# Patient Record
Sex: Male | Born: 1970 | Race: Black or African American | Hispanic: No | Marital: Single | Smoking: Never smoker
Health system: Southern US, Community
[De-identification: ages and names within clinical notes are randomized; demographics above are authoritative.]

## PROBLEM LIST (undated history)

## (undated) DIAGNOSIS — I10 Essential (primary) hypertension: Secondary | ICD-10-CM

## (undated) DIAGNOSIS — M109 Gout, unspecified: Secondary | ICD-10-CM

## (undated) DIAGNOSIS — E119 Type 2 diabetes mellitus without complications: Secondary | ICD-10-CM

## (undated) HISTORY — PX: JOINT REPLACEMENT: SHX530

---

## 2000-03-15 ENCOUNTER — Encounter (INDEPENDENT_AMBULATORY_CARE_PROVIDER_SITE_OTHER): Payer: Self-pay | Admitting: Specialist

## 2000-03-15 ENCOUNTER — Ambulatory Visit (HOSPITAL_COMMUNITY): Admission: RE | Admit: 2000-03-15 | Discharge: 2000-03-15 | Payer: Self-pay | Admitting: Urology

## 2000-10-15 ENCOUNTER — Encounter (INDEPENDENT_AMBULATORY_CARE_PROVIDER_SITE_OTHER): Payer: Self-pay | Admitting: Specialist

## 2000-10-15 ENCOUNTER — Ambulatory Visit (HOSPITAL_COMMUNITY): Admission: RE | Admit: 2000-10-15 | Discharge: 2000-10-15 | Payer: Self-pay | Admitting: Urology

## 2001-08-15 ENCOUNTER — Encounter (INDEPENDENT_AMBULATORY_CARE_PROVIDER_SITE_OTHER): Payer: Self-pay | Admitting: Specialist

## 2001-08-15 ENCOUNTER — Other Ambulatory Visit: Admission: RE | Admit: 2001-08-15 | Discharge: 2001-08-15 | Payer: Self-pay | Admitting: Otolaryngology

## 2008-02-20 ENCOUNTER — Encounter: Admission: RE | Admit: 2008-02-20 | Discharge: 2008-02-20 | Payer: Self-pay | Admitting: Family Medicine

## 2009-02-24 ENCOUNTER — Ambulatory Visit (HOSPITAL_BASED_OUTPATIENT_CLINIC_OR_DEPARTMENT_OTHER): Admission: RE | Admit: 2009-02-24 | Discharge: 2009-02-24 | Payer: Self-pay | Admitting: Orthopedic Surgery

## 2011-04-07 NOTE — Op Note (Signed)
[facility]  Patient:    Jonathan Park, Jonathan Park                   MRN: [id number] Proc. Date: 03/15/00 Adm. Date:  [id number] Attending:  Laqueta Jean                           Operative Report  PREOPERATIVE DIAGNOSIS:  Left varicocele.  POSTOPERATIVE DIAGNOSIS:  Left varicocele.  OPERATION:  Left internal spermatic vein ligation.  SURGEON:  Sigmund I. Patsi Sears, M.D.  ANESTHESIA:  General (LMA).  PREPARATION:  After preoperative preanesthesia, the patient was brought to the operating room and placed on the operating table in the dorsosupine position, where general anesthesia was introduced.  He remained in position, where the pubis was prepped with Betadine solution after shaving, and prepped with Betadine solution and draped in the usual fashion.  DESCRIPTION OF PROCEDURE:  An 8 cm left inguinal incision was made and subcutaneous tissue dissected with the electrosurgical unit.  The spermatic cord was identified and a tape placed underneath the spermatic cord at the level of the pubic tubercle. The spermatic cord was then dissected, with care taken to avoid injury to the artery to the testicle or to the vas or its vessels.  Following this, all multiple veins were identified and ligated with 3-0 silk suture.  The remaining spermatic cord was anesthetized with 0.5 plain Marcaine and replaced in its bed.  The distal portion of the transversus abdominis muscle which was incised which was closed, with no injuries to the ilioinguinal nerve.  The subcutaneous fascia was closed  with running 3-0 Vicryl suture and the skin was closed with skin stapler.  The patient was then awakened and taken to the recovery room after local anesthetic was given in the skin, and given IV Toradol and antiemetic.  He was taken to the recovery room in good condition. DD:  03/15/00 TD:  03/15/00 Job: [id number]

## 2011-04-07 NOTE — Op Note (Signed)
[facility]  Patient:    Jonathan Park, Jonathan Park                     MRN: [id number] Proc. Date: 10/15/00 Attending:  Vonzell Schlatter. Patsi Sears, M.D.                           Operative Report  PREOPERATIVE DIAGNOSES:  Obstructed penile vein and lymphatic.  POSTOPERATIVE DIAGNOSES:  Obstructed penile vein and lymphatic.  OPERATION PERFORMED:  Excision of obstructed penile vein and lymphatic.  SURGEON:  Dr. Patsi Sears.  ANESTHESIA:  General (LMA).  PREPARATION:  After appropriate preanesthesia, the patient was brought to the operating room and placed on the operating table in dorsal supine position where general LMA anesthesia was introduced. He remained in this position where the penis was prepped with Betadine solution and draped in the usual fashion.  DESCRIPTION OF PROCEDURE:  A circumcising incision was made in the skin sleeve proximal-ward. A large penile vein was identified and this was dissected proximally and laterally. Also a superficial obstructed lymphatic was identified. Both of these were ligated with 4-0 silk suture. A button hole was observed in the hole at the dorsal of the penis and this was closed from the inside with 2 layers of 5-0 Vicryl suture. Minimal bleeding was noted. The wound was irrigated with saline solution. The wound was closed in the following fashion:  CLOSURE:  The distal penile skin was drawn down toward the glans. Four separate quadrant sutures of 4-0 Vicryl suture were then created, and each quadrant was closed with interrupted 4-0 Vicryl suture. Sterile dressings were applied, and the patient was awakened and taken to the recovery room in good condition. DD:  10/15/00 TD:  10/15/00 Job: [id number]

## 2011-04-07 NOTE — Op Note (Signed)
NAME:  Jonathan Park, Jonathan Park            ACCOUNT NO.:  [id number]   MEDICAL RECORD NO.:  [id number]          PATIENT TYPE:  AMB   LOCATION:  DSC                          FACILITY:  [facility]   PHYSICIAN:  Matthew A. Weingold, M.D.DATE OF BIRTH:  [birth year]   DATE OF PROCEDURE:  DATE OF DISCHARGE:                               OPERATIVE REPORT   PREOPERATIVE DIAGNOSIS:  Recurrent right cubital tunnel syndrome.   POSTOPERATIVE DIAGNOSIS:  Recurrent right cubital tunnel syndrome.   PROCEDURE:  Exploration and decompression, right elbow ulnar nerve with  submuscular transposition.   SURGEONS:  1. Artist Pais Mina Marble, MD  2. Cindee Salt, MD   ANESTHESIA:  General.   TOURNIQUET TIME:  One hour and 5 minutes.   No complications.   No drains.   OPERATIVE REPORT:  The patient was taken to the operating suite.  After  the induction of adequate general anesthesia, right upper extremity was  prepped and draped in usual sterile fashion.  An Esmarch was used to  exsanguinate the limb.  Tourniquet was then inflated to 250 mmHg.  At  this point in time, incorporating the old incision this was extended  proximally and distally 3 cm in our direction.  The skin was incised on  the medial aspect of the elbow between the olecranon process, tip of the  medial epicondyle, and skin was incised sharply.  Dissection was carried  down to the area of the cubital tunnel.  In the proximal aspect of the  incision, the ulnar nerve was identified, it was carefully traced into  the zone of scarring and carefully dissected free throughout the entire  length of the incision with care to identify and retract articular  branches.  Once this was done, dissection was carried down to the level  of the inner muscular septum.  This was carefully traced down to the  insertion on the humerus and excised.  The flexor pronator mass was then  carefully elevated off the medial epicondyle and retracted to the  midline.  Once this  was done and all bleeding points were cauterized,  the ulnar nerve was then transposed anteriorly and the flexor pronator  mass was then repaired using #2 FiberWire suture to its normal insertion  site.  After this was done, all pressure points had been relieved.  There was no undue tension on the nerve.  The wound was thoroughly  irrigated.  It was closed in layers of 2-0 undyed Vicryl and 4-0  Monocryl subcuticular stitch on the skin.  Steri-Strips, 4 x 4's,  fluffs, and a splint was applied with the elbow flexed to neutral, on  the wrist slightly flexed to protect the flexor pronator mass repair.  The patient tolerated the procedure well, went to recovery room in  stable fashion.       Artist Pais Mina Marble, M.D.  Electronically Signed     MAW/MEDQ  D:  02/25/2009  T:  02/25/2009  Job:  [id number]

## 2012-02-20 DIAGNOSIS — E119 Type 2 diabetes mellitus without complications: Secondary | ICD-10-CM | POA: Insufficient documentation

## 2013-01-27 ENCOUNTER — Other Ambulatory Visit: Payer: Self-pay | Admitting: Occupational Medicine

## 2013-01-27 ENCOUNTER — Ambulatory Visit: Payer: Self-pay

## 2013-01-27 DIAGNOSIS — Z021 Encounter for pre-employment examination: Secondary | ICD-10-CM

## 2013-05-01 ENCOUNTER — Emergency Department (HOSPITAL_BASED_OUTPATIENT_CLINIC_OR_DEPARTMENT_OTHER)
Admission: EM | Admit: 2013-05-01 | Discharge: 2013-05-01 | Disposition: A | Discharge status: Home / Self Care | Payer: Self-pay | Attending: Emergency Medicine | Admitting: Emergency Medicine

## 2013-05-01 ENCOUNTER — Telehealth (HOSPITAL_COMMUNITY): Payer: Self-pay | Admitting: Emergency Medicine

## 2013-05-01 ENCOUNTER — Encounter (HOSPITAL_BASED_OUTPATIENT_CLINIC_OR_DEPARTMENT_OTHER): Payer: Self-pay | Admitting: *Deleted

## 2013-05-01 DIAGNOSIS — I10 Essential (primary) hypertension: Secondary | ICD-10-CM | POA: Insufficient documentation

## 2013-05-01 DIAGNOSIS — Z79899 Other long term (current) drug therapy: Secondary | ICD-10-CM | POA: Insufficient documentation

## 2013-05-01 DIAGNOSIS — H109 Unspecified conjunctivitis: Secondary | ICD-10-CM | POA: Insufficient documentation

## 2013-05-01 DIAGNOSIS — E119 Type 2 diabetes mellitus without complications: Secondary | ICD-10-CM | POA: Insufficient documentation

## 2013-05-01 HISTORY — DX: Type 2 diabetes mellitus without complications: E11.9

## 2013-05-01 HISTORY — DX: Essential (primary) hypertension: I10

## 2013-05-01 MED ORDER — HYDROCODONE-ACETAMINOPHEN 5-325 MG PO TABS
1.0000 | ORAL_TABLET | Freq: Once | ORAL | Status: AC
  Administered 2013-05-01: 1 via ORAL
  Filled 2013-05-01: qty 1

## 2013-05-01 MED ORDER — TETRACAINE HCL 0.5 % OP SOLN
OPHTHALMIC | Status: AC
  Filled 2013-05-01: qty 2

## 2013-05-01 MED ORDER — ERYTHROMYCIN 5 MG/GM OP OINT
TOPICAL_OINTMENT | Freq: Four times a day (QID) | OPHTHALMIC | Status: DC
  Administered 2013-05-01: 01:00:00 via OPHTHALMIC
  Filled 2013-05-01: qty 3.5

## 2013-05-01 MED ORDER — HYDROCODONE-ACETAMINOPHEN 5-325 MG PO TABS: 1.0000 | ORAL_TABLET | ORAL | Status: DC | PRN

## 2013-05-01 MED ORDER — FLUORESCEIN SODIUM 1 MG OP STRP
ORAL_STRIP | OPHTHALMIC | Status: AC
  Filled 2013-05-01: qty 1

## 2013-05-01 NOTE — ED Notes (Signed)
Pt c/o left eye redness and irritation x 1 day  

## 2013-05-01 NOTE — ED Provider Notes (Signed)
History     CSN: [id number]  Arrival date & time 05/01/13  0009   First MD Initiated Contact with Patient 05/01/13 0022      Chief Complaint  Patient presents with  . Eye Problem    (Consider location/radiation/quality/duration/timing/severity/associated sxs/prior treatment) HPI This is a 42 year old male with about a 24-hour history of the sensation of a foreign body in his left eye. It has been associated with edema of the eyelids. The symptoms are mild to moderate. He is unaware of any injury. He describes the sensation as like a hair or other object up under his operative. He denies blurred vision. He has used Visine without adequate relief.  Past Medical History  Diagnosis Date  . Diabetes mellitus without complication   . Hypertension     Past Surgical History  Procedure Laterality Date  . Joint replacement      History reviewed. No pertinent family history.  History  Substance Use Topics  . Smoking status: Never Smoker   . Smokeless tobacco: Not on file  . Alcohol Use: No      Review of Systems  All other systems reviewed and are negative.    Allergies  Review of patient's allergies indicates no known allergies.  Home Medications   Current Outpatient Rx  Name  Route  Sig  Dispense  Refill  . lisinopril (PRINIVIL,ZESTRIL) 10 MG tablet   Oral   Take 10 mg by mouth daily.         . metFORMIN (GLUCOPHAGE) 500 MG tablet   Oral   Take 500 mg by mouth 2 (two) times daily with a meal.           BP 130/105  Pulse 87  Temp(Src) 98.4 F (36.9 C) (Oral)  Resp 16  Ht 6\' 5"  (1.956 m)  Wt 250 lb (113.399 kg)  BMI 29.64 kg/m2  SpO2 99%  Physical Exam General: Well-developed, well-nourished male in no acute distress; appearance consistent with age of record HENT: normocephalic, atraumatic Eyes: pupils equal round and reactive to light; extraocular muscles intact; arcus senilis bilaterally; edema of left eyelids without erythema, induration or  tenderness; mild left conjunctival injection; no hyphema or hypopyon; no fluorescein uptake left eye Neck: supple Heart: regular rate and rhythm Lungs: Normal respiratory effort and excursion Abdomen: soft; nondistended Extremities: No deformity; full range of motion Neurologic: Awake, alert and oriented; motor function intact in all extremities and symmetric; no facial droop Skin: Warm and dry Psychiatric: Normal mood and affect    ED Course  Procedures (including critical care time)    MDM  We'll treat for conjunctivitis. Patient was advised the diagnosis is nonspecific.        Hanley Seamen, MD 05/01/13 [id number]

## 2013-10-24 ENCOUNTER — Emergency Department (HOSPITAL_BASED_OUTPATIENT_CLINIC_OR_DEPARTMENT_OTHER): Payer: Self-pay

## 2013-10-24 ENCOUNTER — Encounter (HOSPITAL_BASED_OUTPATIENT_CLINIC_OR_DEPARTMENT_OTHER): Payer: Self-pay | Admitting: Emergency Medicine

## 2013-10-24 ENCOUNTER — Emergency Department (HOSPITAL_BASED_OUTPATIENT_CLINIC_OR_DEPARTMENT_OTHER)
Admission: EM | Admit: 2013-10-24 | Discharge: 2013-10-24 | Disposition: A | Discharge status: Home / Self Care | Payer: Self-pay | Attending: Emergency Medicine | Admitting: Emergency Medicine

## 2013-10-24 DIAGNOSIS — E119 Type 2 diabetes mellitus without complications: Secondary | ICD-10-CM | POA: Insufficient documentation

## 2013-10-24 DIAGNOSIS — M25461 Effusion, right knee: Secondary | ICD-10-CM

## 2013-10-24 DIAGNOSIS — Z79899 Other long term (current) drug therapy: Secondary | ICD-10-CM | POA: Insufficient documentation

## 2013-10-24 DIAGNOSIS — M25469 Effusion, unspecified knee: Secondary | ICD-10-CM | POA: Insufficient documentation

## 2013-10-24 DIAGNOSIS — I10 Essential (primary) hypertension: Secondary | ICD-10-CM | POA: Insufficient documentation

## 2013-10-24 MED ORDER — OXYCODONE-ACETAMINOPHEN 5-325 MG PO TABS
1.0000 | ORAL_TABLET | Freq: Once | ORAL | Status: AC
  Administered 2013-10-24: 1 via ORAL
  Filled 2013-10-24: qty 1

## 2013-10-24 MED ORDER — OXYCODONE-ACETAMINOPHEN 5-325 MG PO TABS: 1.0000 | ORAL_TABLET | ORAL | Status: DC | PRN

## 2013-10-24 NOTE — ED Notes (Signed)
Pt called requesting "referral information". Pt advised to look at discharge paperwork as Dr. Pearletha Forge information is documented for followup

## 2013-10-24 NOTE — ED Provider Notes (Signed)
CSN: [id number]     Arrival date & time 10/24/13  1414 History   First MD Initiated Contact with Patient 10/24/13 1422     Chief Complaint  Patient presents with  . 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 No language interpreter was used.    Past Medical History  Diagnosis Date  . Diabetes mellitus without complication   . Hypertension    Past Surgical History  Procedure Laterality Date  . Joint replacement     No family history on file. History  Substance Use Topics  . Smoking status: Never Smoker   . Smokeless tobacco: Not on file  . Alcohol Use: Yes    Review of Systems  Constitutional: Negative.   Respiratory: Negative.   Cardiovascular: Negative.     Allergies  Review of patient's allergies indicates no known allergies.  Home Medications   Current Outpatient Rx  Name  Route  Sig  Dispense  Refill  . HYDROcodone-acetaminophen (NORCO) 5-325 MG per tablet   Oral   Take 1 tablet by mouth every 4 (four) hours as needed for pain.   10 tablet   0   . lisinopril (PRINIVIL,ZESTRIL) 10 MG tablet   Oral   Take 10 mg by mouth daily.         . metFORMIN (GLUCOPHAGE) 500 MG tablet   Oral   Take 500 mg by mouth 2 (two) times daily with a meal.          BP 128/93  Pulse 126  Temp(Src) 98.3 F (36.8 C) (Oral)  Resp 20  Wt 250 lb (113.399 kg)  SpO2 99% Physical Exam  Nursing note and vitals reviewed. Constitutional: He is oriented to person, place, and time. He appears well-developed and well-nourished.  Cardiovascular: Normal rate and regular  rhythm.   Pulmonary/Chest: Breath sounds normal.  Musculoskeletal: Normal range of motion.  Obvious swelling noted to the right knee:no redness or warmth noted to the area  Neurological: He is alert and oriented to person, place, and time.  Skin: Skin is warm and dry.  Psychiatric: He has a normal mood and affect.    ED Course  Procedures (including critical care time) Labs Review Labs Reviewed - No data to display Imaging Review Dg Knee 1-2 Views Right  10/24/2013   CLINICAL DATA:  Knee pain  EXAM: RIGHT KNEE - 1-2 VIEW  COMPARISON:  None.  FINDINGS: There is no evidence of acute fracture or dislocation. A lobulated masslike density projects in the suprapatellar region may represent a suprapatellar effusion. A mass within this area cannot be excluded. 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 There also findings consistent with edema in the prepatellar soft tissues. Areas of increased density within the joint conforming along the femoral condyles may represent sequela of a small effusion.  IMPRESSION: 1. No evidence of acute osseous abnormalities 2. Increased density in the suprapatellar region may represent a suprapatellar effusion on mass cannot be excluded. There also findings which raise concern of prepatellar edema as well as  a joint effusion. Further evaluation with MRI is recommended.   Electronically Signed   By: Salome Holmes M.D.   On: 10/24/2013 15:06    EKG Interpretation   None       MDM   1. Knee effusion, right    Pt immobilized for discomfort:discussed with pt that need for mri for further evaluation:pt given percocet    Teressa Lower, NP 10/24/13 1526

## 2013-10-24 NOTE — ED Notes (Signed)
Woke with swelling in his right knee and pain in his lower back. No known injury.

## 2013-10-25 ENCOUNTER — Emergency Department (HOSPITAL_COMMUNITY)
Admission: EM | Admit: 2013-10-25 | Discharge: 2013-10-25 | Discharge status: Against Medical Advice | Payer: Self-pay | Attending: Emergency Medicine | Admitting: Emergency Medicine

## 2013-10-25 DIAGNOSIS — M25469 Effusion, unspecified knee: Secondary | ICD-10-CM | POA: Insufficient documentation

## 2013-10-25 DIAGNOSIS — E119 Type 2 diabetes mellitus without complications: Secondary | ICD-10-CM | POA: Insufficient documentation

## 2013-10-25 DIAGNOSIS — M25461 Effusion, right knee: Secondary | ICD-10-CM

## 2013-10-25 DIAGNOSIS — Z79899 Other long term (current) drug therapy: Secondary | ICD-10-CM | POA: Insufficient documentation

## 2013-10-25 DIAGNOSIS — I10 Essential (primary) hypertension: Secondary | ICD-10-CM | POA: Insufficient documentation

## 2013-10-25 MED ORDER — KETOROLAC TROMETHAMINE 60 MG/2ML IM SOLN
60.0000 mg | Freq: Once | INTRAMUSCULAR | Status: DC
  Filled 2013-10-25: qty 2

## 2013-10-25 MED ORDER — HYDROMORPHONE HCL PF 1 MG/ML IJ SOLN: 1.0000 mg | Freq: Once | INTRAMUSCULAR | Status: DC

## 2013-10-25 NOTE — ED Notes (Signed)
Patient left department before discharge.  PA signed patient out AMA due to refusing to stay for arrangement of follow up care for injury.

## 2013-10-25 NOTE — ED Notes (Signed)
Patient complaint of right knee pain starting on Tuesday.  Patient states he woke up with pain and swelling to the right knee.  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  [facility] and had an x-ray done on the knee and was given percocet and put on crutches and knee immobilizer.  States he was unable to sleep due to pain getting worse.

## 2013-10-25 NOTE — ED Provider Notes (Signed)
Medical screening examination/treatment/procedure(s) were performed by non-physician practitioner and as supervising physician I was immediately available for consultation/collaboration.  EKG Interpretation   None        Doug Sou, MD 10/25/13 1511

## 2013-10-25 NOTE — ED Provider Notes (Signed)
CSN: [id number]     Arrival date & time 10/25/13  7829 History  This chart was scribed for non-physician practitioner, Antony Madura, PA-C working with Doug Sou, MD by Greggory Stallion, ED scribe. This patient was seen in room TR09C/TR09C and the patient's care was started at 9:29 AM.    Chief Complaint  Patient presents with  . Knee Pain   The history is provided by the patient. No language interpreter was used.   HPI Comments: Jonathan Park is a 42 y.o. male who presents to the Emergency Department complaining of gradual onset, constant right knee pain that started 3 days ago. He states his knee started swelling 4 days ago but has started to resolve. Ambulation and movement worsen the pain. Pt was seen here yesterday and given percocet, knee immobilizer and crutches with no relief. He has iced his knee 3 times in the last 3 days with no relief. Denies fever, red streaking, erythema, numbness, tingling, weakness. Pt has called the referral he was given but can not get an appointment for another few weeks. Denies prior history of injury or trauma. Denies history of IV drug use or gout.   Past Medical History  Diagnosis Date  . Diabetes mellitus without complication   . Hypertension    Past Surgical History  Procedure Laterality Date  . Joint replacement     No family history on file. History  Substance Use Topics  . Smoking status: Never Smoker   . Smokeless tobacco: Not on file  . Alcohol Use: Yes    Review of Systems  Constitutional: Negative for fever.  Musculoskeletal: Positive for arthralgias and joint swelling.  Skin: Negative for color change.  Neurological: Negative for weakness and numbness.  All other systems reviewed and are negative.    Allergies  Review of patient's allergies indicates no known allergies.  Home Medications   Current Outpatient Rx  Name  Route  Sig  Dispense  Refill  . glipiZIDE (GLUCOTROL XL) 10 MG 24 hr tablet   Oral   Take 10 mg by  mouth daily with breakfast.         . lisinopril (PRINIVIL,ZESTRIL) 10 MG tablet   Oral   Take 10 mg by mouth daily.         . metFORMIN (GLUCOPHAGE) 500 MG tablet   Oral   Take 500 mg by mouth 2 (two) times daily with a meal.         . oxyCODONE-acetaminophen (PERCOCET/ROXICET) 5-325 MG per tablet   Oral   Take 1-2 tablets by mouth every 4 (four) hours as needed for severe pain.   15 tablet   0   . simvastatin (ZOCOR) 40 MG tablet   Oral   Take 40 mg by mouth daily.          BP 137/87  Pulse 116  Temp(Src) 96 F (35.6 C) (Oral)  Resp 18  Ht 6\' 5"  (1.956 m)  Wt 250 lb (113.399 kg)  BMI 29.64 kg/m2  SpO2 99%  Physical Exam  Nursing note and vitals reviewed. Constitutional: He is oriented to person, place, and time. He appears well-developed and well-nourished. No distress.  HENT:  Head: Normocephalic and atraumatic.  Eyes: Conjunctivae and EOM are normal. No scleral icterus.  Neck: Normal range of motion.  Cardiovascular: Normal rate, regular rhythm and intact distal pulses.   2+ DP and PT pulses on right  Pulmonary/Chest: Effort normal. No respiratory distress.  Musculoskeletal: Normal range of motion. He  exhibits no tenderness.  Swelling noted to the suprapatellar region of right knee. Effusion superior to the right patella. No erythema or heat to touch. No red linear streaking. Decreased ROM with R knee flexion secondary to pain. Normal knee extension. No laxity.  Neurological: He is alert and oriented to person, place, and time. He has normal reflexes.  Reflex Scores:      Patellar reflexes are 2+ on the right side.      Achilles reflexes are 2+ on the right side. No gross sensory deficits. Patient weight bearing.  Skin: Skin is warm and dry. No rash noted. He is not diaphoretic. No erythema. No pallor.  Psychiatric: He has a normal mood and affect. His behavior is normal.    ED Course  Procedures (including critical care time) DIAGNOSTIC  STUDIES: Oxygen Saturation is 99% on RA, normal by my interpretation.    COORDINATION OF CARE: 9:36 AM-Discussed treatment plan with patient which includes pain medication in the ED. Patient agrees.   Labs Review Labs Reviewed - No data to display Imaging Review Dg Knee 1-2 Views Right  10/24/2013   CLINICAL DATA:  Knee pain  EXAM: RIGHT KNEE - 1-2 VIEW  COMPARISON:  None.  FINDINGS: There is no evidence of acute fracture or dislocation. A lobulated masslike density projects in the suprapatellar region may represent a suprapatellar effusion. A mass within this area cannot be excluded. 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 There also findings consistent with edema in the prepatellar soft tissues. Areas of increased density within the joint conforming along the femoral condyles may represent sequela of a small effusion.  IMPRESSION: 1. No evidence of acute osseous abnormalities 2. Increased density in the suprapatellar region may represent a suprapatellar effusion on mass cannot be excluded. There also findings which raise concern of prepatellar edema as well as a joint effusion. Further evaluation with MRI is recommended.   Electronically Signed   By: Salome Holmes M.D.   On: 10/24/2013 15:06    EKG Interpretation   None       MDM   1. Knee swelling, right    Patient presents for R knee swelling x 4 days. No fevers, chills, redness, or heat to touch of affected joint. No evidence of septic joint. Patient neurovascularly intact. Toradol IM ordered for pain control in ED. Patient seen and evaluated for same yesterday and instructed to have MRI completed as outpatient. Was not referred to orthopedics; will consult today.   Have consulted with Dr. Charlann Boxer who has reviewed images. Believes patient stable for outpatient MRI with ACE wrap and ice to the area for symptoms. Advises f/u in office this week with either Dr.  Zachery Dauer or Dr. Penni Bombard. Patient already taking 2 percocet every 4 hours for pain control; have discussed with patient that I would not prescribe something stronger than this for his pain. Have also reviewed plan of outpatient MRI and orthopedic follow up. Patient became enraged stating, "I came here to have an MRI today. Y'all have done nothing for me and you're wasting my time." Patient insisting on d/c papers; however, left ED before d/c completed. I have stated to patient that he will be signed out AMA today as he has neglected to wait for orthopedic f/u contact info and outpatient MR order. Patient responds, "I don't care. I'm leaving."  I personally performed the services described in this documentation, which was scribed in my  presence. The recorded information has been reviewed and is accurate.    Antony Madura, PA-C 10/25/13 1035

## 2013-10-26 NOTE — ED Provider Notes (Signed)
Medical screening examination/treatment/procedure(s) were performed by non-physician practitioner and as supervising physician I was immediately available for consultation/collaboration.  EKG Interpretation   None         Candyce Churn, MD 10/26/13 1116

## 2013-10-30 ENCOUNTER — Ambulatory Visit: Payer: Self-pay | Admitting: Family Medicine

## 2013-11-03 ENCOUNTER — Other Ambulatory Visit: Payer: Self-pay | Admitting: Family Medicine

## 2013-11-03 ENCOUNTER — Encounter: Payer: Self-pay | Admitting: Family Medicine

## 2013-11-03 ENCOUNTER — Ambulatory Visit (INDEPENDENT_AMBULATORY_CARE_PROVIDER_SITE_OTHER): Payer: Self-pay | Admitting: Family Medicine

## 2013-11-03 VITALS — BP 158/106 | HR 97 | Ht 77.0 in | Wt 250.0 lb

## 2013-11-03 DIAGNOSIS — M25469 Effusion, unspecified knee: Secondary | ICD-10-CM

## 2013-11-03 DIAGNOSIS — M25461 Effusion, right knee: Secondary | ICD-10-CM

## 2013-11-03 MED ORDER — OXYCODONE-ACETAMINOPHEN 5-325 MG PO TABS: 1.0000 | ORAL_TABLET | Freq: Four times a day (QID) | ORAL | Status: DC | PRN

## 2013-11-03 NOTE — Patient Instructions (Addendum)
You have a knee effusion. This is commonly due to gout or pseudogout. We will analyze the fluid, usually have results within 2 days. Call me Wednesday afternoon if you haven't heard from [place]. Crutches, immobilizer only if needed. Icing 15 minutes at a time 3-4 times a day. Compression wrap and elevation to help with swelling. Aleve 2 tabs twice a day with food OR ibuprofen 600mg  three times a day with food for pain and inflammation - take either one of these regularly until your swelling, pain subsides. Follow up with me in 2 weeks if not improving.  Otherwise see me in 1 month.

## 2013-11-04 LAB — SYNOVIAL CELL COUNT + DIFF, W/ CRYSTALS: WBC, Synovial: 4185 cu mm — ABNORMAL HIGH (ref 0–200)

## 2013-11-04 LAB — GLUCOSE, SYNOVIAL FLUID: Glucose, Synovial Fluid: 145 mg/dL

## 2013-11-05 ENCOUNTER — Encounter: Payer: Self-pay | Admitting: Family Medicine

## 2013-11-05 DIAGNOSIS — M1A9XX0 Chronic gout, unspecified, without tophus (tophi): Secondary | ICD-10-CM | POA: Insufficient documentation

## 2013-11-05 NOTE — Assessment & Plan Note (Signed)
atraumatic presentation.  History of similar issue in his foot.  I suspect gout or pseudogout as cause.  Aspiration and injection performed today.  Sent fluid for analysis.  Icing, compression, nsaids.  Crutches and immobilizer only if needed.  F/u in 1 month - consider checking uric acid, starting preventative medication if confirmed and he would like to go ahead with this.  After informed written consent patient was lying supine on exam table.  Right knee was prepped with and alcohol swab.  Utilizing superolateral approach, 3 mL of marcaine was used for local anesthesia.  Then using an 18g needle on 60cc syringe, 45 mL of cloudy straw-colored fluid was aspirated from right knee.  Knee was then injected with 3:1 marcaine:depomedrol.  Patient tolerated procedure well without immediate complications.

## 2013-11-05 NOTE — Progress Notes (Signed)
Patient ID: Jonathan Park, male   DOB: [birth year], 42 y.o.   MRN: [id number]  PCP: No PCP Per Patient  Subjective:   HPI: Patient is a 42 y.o. male here for right knee effusion.  Patient denies known injury. He states he woke up on 12/4 and noticed his right knee was very swollen. Initially was warm, painful. 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 Pain has improved since and swelling gone down a little. Has tried percocet, crutches, icing, immobilizer. History of similar pain in his foot before. Was going to have an MRI in the ED but had to leave before obtaining this.  Past Medical History  Diagnosis Date  . Diabetes mellitus without complication   . Hypertension     Current Outpatient Prescriptions on File Prior to Visit  Medication Sig Dispense Refill  . glipiZIDE (GLUCOTROL XL) 10 MG 24 hr tablet Take 10 mg by mouth daily with breakfast.      . lisinopril (PRINIVIL,ZESTRIL) 10 MG tablet Take 10 mg by mouth daily.      . metFORMIN (GLUCOPHAGE) 500 MG tablet Take 500 mg by mouth 2 (two) times daily with a meal.      . simvastatin (ZOCOR) 40 MG tablet Take 40 mg by mouth daily.       No current facility-administered medications on file prior to visit.    Past Surgical History  Procedure Laterality Date  . Joint replacement      No Known Allergies  History   Social History  . Marital Status: Single    Spouse Name: N/A    Number of Children: N/A  . Years of Education: N/A   Occupational History  . Not on file.   Social History Main Topics  . Smoking status: Never Smoker   . Smokeless tobacco: Not on file  . Alcohol Use: Yes  . Drug Use: No  . Sexual Activity: No   Other Topics Concern  . Not on file   Social History Narrative  . No narrative on file    Family History  Problem Relation Age of Onset  . Sudden death Neg Hx   . Hypertension Neg Hx   . Hyperlipidemia Neg Hx   . Heart attack Neg Hx   .  Diabetes Neg Hx     BP 158/106  Pulse 97  Ht 6\' 5"  (1.956 m)  Wt 250 lb (113.399 kg)  BMI 29.64 kg/m2  Review of Systems: See HPI above.    Objective:  Physical Exam:  Gen: NAD  Right knee: Large effusion.  No bruising, other deformity. Mild anterior diffuse TTP.  No other TTP. ROM 0 - 110 degrees.  Flexion limited - painful at 110 degrees. Negative ant/post drawers. Negative valgus/varus testing. Negative lachmanns. Negative mcmurrays, apleys, patellar apprehension. NV intact distally.    Assessment & Plan:  1. Right knee effusion - atraumatic presentation.  History of similar issue in his foot.  I suspect gout or pseudogout as cause.  Aspiration and injection performed today.  Sent fluid for analysis.  Icing, compression, nsaids.  Crutches and immobilizer only if needed.  F/u in 1 month - consider checking uric acid, starting preventative medication if confirmed and he would like to go ahead with this.  After informed written consent patient was lying supine on exam table.  Right knee was prepped with and alcohol swab.  Utilizing superolateral approach, 3 mL of marcaine was used for local anesthesia.  Then using an 18g needle on 60cc syringe, 45 mL of cloudy straw-colored fluid was aspirated from right knee.  Knee was then injected with 3:1 marcaine:depomedrol.  Patient tolerated procedure well without immediate complications.  Addendum:  Patient's fluid analysis does show urate crystals consistent with gout.  Patient informed.  F/u in 1 month.

## 2013-11-07 LAB — BODY FLUID CULTURE: Gram Stain: NONE SEEN

## 2013-11-25 ENCOUNTER — Ambulatory Visit (INDEPENDENT_AMBULATORY_CARE_PROVIDER_SITE_OTHER): Payer: Self-pay | Admitting: Sports Medicine

## 2013-11-25 ENCOUNTER — Encounter: Payer: Self-pay | Admitting: Sports Medicine

## 2013-11-25 ENCOUNTER — Telehealth: Payer: Self-pay

## 2013-11-25 VITALS — BP 141/91 | HR 131 | Wt 236.0 lb

## 2013-11-25 DIAGNOSIS — M25469 Effusion, unspecified knee: Secondary | ICD-10-CM

## 2013-11-25 DIAGNOSIS — M109 Gout, unspecified: Secondary | ICD-10-CM

## 2013-11-25 MED ORDER — MELOXICAM 15 MG PO TABS: ORAL_TABLET | ORAL | Status: DC

## 2013-11-25 MED ORDER — HYDROCODONE-ACETAMINOPHEN 5-325 MG PO TABS: 1.0000 | ORAL_TABLET | Freq: Three times a day (TID) | ORAL | Status: DC | PRN

## 2013-11-25 MED ORDER — ALLOPURINOL 300 MG PO TABS: 300.0000 mg | ORAL_TABLET | Freq: Two times a day (BID) | ORAL | Status: DC

## 2013-11-25 NOTE — Telephone Encounter (Signed)
Short course Rxed and in box.

## 2013-11-25 NOTE — Progress Notes (Signed)
   Subjective:    I'm seeing this patient as a consultation for:  Dr. Pearletha ForgeHudnall  CC: Right knee pain  HPI: This is a pleasant 43 year old male, approximately one month ago he developed increasing swelling and pain in the right knee. He was seen by Dr. Pearletha ForgeHudnall who performed an aspiration, injection of Depo-Medrol, fluid was sent for crystal analysis which did show uric acid crystals. Cultures were negative. Unfortunately his pain and swelling has recurred. Pain is severe, persistent. No fevers, chills or other constitutional symptoms.  Past medical history, Surgical history, Family history not pertinant except as noted below, Social history, Allergies, and medications have been entered into the medical record, reviewed, and no changes needed.   Review of Systems: No headache, visual changes, nausea, vomiting, diarrhea, constipation, dizziness, abdominal pain, skin rash, fevers, chills, night sweats, weight loss, swollen lymph nodes, body aches, joint swelling, muscle aches, chest pain, shortness of breath, mood changes, visual or auditory hallucinations.   Objective:   General: Well Developed, well nourished, and in no acute distress.  Neuro/Psych: Alert and oriented x3, extra-ocular muscles intact, able to move all 4 extremities, sensation grossly intact. Skin: Warm and dry, no rashes noted.  Respiratory: Not using accessory muscles, speaking in full sentences, trachea midline.  Cardiovascular: Pulses palpable, no extremity edema. Abdomen: Does not appear distended. Right Knee: Large visible and palpable effusion. ROM full in flexion and extension and lower leg rotation. Ligaments with solid consistent endpoints including ACL, PCL, LCL, MCL. Negative Mcmurray's, Apley's, and Thessalonian tests. Non painful patellar compression. Patellar glide without crepitus. Patellar and quadriceps tendons unremarkable. Quadriceps are very weak.   Procedure: Real-time Ultrasound Guided  aspiration/Injection of right knee Device: GE Logiq E  Verbal informed consent obtained.  Time-out conducted.  Noted no overlying erythema, induration, or other signs of local infection.  Skin prepped in a sterile fashion.  Local anesthesia: Topical Ethyl chloride.  With sterile technique and under real time ultrasound guidance:  22-gauge needle advanced into the suprapatellar recess which was full of fluid, 70 cc of cloudy straw-colored fluid was aspirated, syringe switch to cc Kenalog 40, 4 cc lidocaine injected easily. Completed without difficulty  Pain immediately resolved suggesting accurate placement of the medication.  Advised to call if fevers/chills, erythema, induration, drainage, or persistent bleeding.  Images permanently stored and available for review in the ultrasound unit.  Impression: Technically successful ultrasound guided injection.  Impression and Recommendations:   This case required medical decision making of moderate complexity.

## 2013-11-25 NOTE — Telephone Encounter (Signed)
Patient was in office today and was given a knee injection he stated that the pain medication he was prescribed is not working and he  Is requesting  Rx for Hydrocodone  for pain.  Kyndel Egger,CMA

## 2013-11-25 NOTE — Telephone Encounter (Signed)
Spoke to patient advised him that Rx hydrocodone was ready for pickup at the office. Tessla Spurling,CMA

## 2013-11-25 NOTE — Assessment & Plan Note (Addendum)
With recurrent effusion in the right knee. He has had some indiscretions with red meat and alcohol. We discussed cutting the amount of consumption half. Aspiration and injection as above. I am going to check serum uric acid levels and start allopurinol with a goal uric acid level of less than 5. He may followup in one month with Dr. Pearletha ForgeHudnall.

## 2013-11-26 LAB — URIC ACID: Uric Acid, Serum: 6.4 mg/dL (ref 4.0–7.8)

## 2013-11-27 ENCOUNTER — Ambulatory Visit: Payer: Self-pay | Admitting: Sports Medicine

## 2014-12-23 ENCOUNTER — Encounter: Payer: Self-pay | Admitting: Family Medicine

## 2014-12-23 ENCOUNTER — Encounter (HOSPITAL_BASED_OUTPATIENT_CLINIC_OR_DEPARTMENT_OTHER): Payer: Self-pay | Admitting: *Deleted

## 2014-12-23 ENCOUNTER — Emergency Department (HOSPITAL_BASED_OUTPATIENT_CLINIC_OR_DEPARTMENT_OTHER): Payer: Self-pay

## 2014-12-23 ENCOUNTER — Ambulatory Visit (INDEPENDENT_AMBULATORY_CARE_PROVIDER_SITE_OTHER): Payer: Self-pay | Admitting: Family Medicine

## 2014-12-23 ENCOUNTER — Emergency Department (HOSPITAL_BASED_OUTPATIENT_CLINIC_OR_DEPARTMENT_OTHER)
Admission: EM | Admit: 2014-12-23 | Discharge: 2014-12-23 | Disposition: A | Discharge status: Home / Self Care | Payer: Self-pay | Attending: Emergency Medicine | Admitting: Emergency Medicine

## 2014-12-23 VITALS — BP 108/74 | HR 130 | Ht 77.0 in | Wt 255.0 lb

## 2014-12-23 DIAGNOSIS — M25562 Pain in left knee: Secondary | ICD-10-CM

## 2014-12-23 DIAGNOSIS — Z79899 Other long term (current) drug therapy: Secondary | ICD-10-CM | POA: Insufficient documentation

## 2014-12-23 DIAGNOSIS — I10 Essential (primary) hypertension: Secondary | ICD-10-CM | POA: Insufficient documentation

## 2014-12-23 DIAGNOSIS — M25561 Pain in right knee: Secondary | ICD-10-CM

## 2014-12-23 DIAGNOSIS — W1839XA Other fall on same level, initial encounter: Secondary | ICD-10-CM | POA: Insufficient documentation

## 2014-12-23 DIAGNOSIS — Y998 Other external cause status: Secondary | ICD-10-CM | POA: Insufficient documentation

## 2014-12-23 DIAGNOSIS — S8002XA Contusion of left knee, initial encounter: Secondary | ICD-10-CM | POA: Insufficient documentation

## 2014-12-23 DIAGNOSIS — M25462 Effusion, left knee: Secondary | ICD-10-CM

## 2014-12-23 DIAGNOSIS — M25461 Effusion, right knee: Secondary | ICD-10-CM

## 2014-12-23 DIAGNOSIS — Y9289 Other specified places as the place of occurrence of the external cause: Secondary | ICD-10-CM | POA: Insufficient documentation

## 2014-12-23 DIAGNOSIS — S8001XA Contusion of right knee, initial encounter: Secondary | ICD-10-CM | POA: Insufficient documentation

## 2014-12-23 DIAGNOSIS — Y9389 Activity, other specified: Secondary | ICD-10-CM | POA: Insufficient documentation

## 2014-12-23 DIAGNOSIS — E119 Type 2 diabetes mellitus without complications: Secondary | ICD-10-CM | POA: Insufficient documentation

## 2014-12-23 DIAGNOSIS — M109 Gout, unspecified: Secondary | ICD-10-CM | POA: Insufficient documentation

## 2014-12-23 HISTORY — DX: Gout, unspecified: M10.9

## 2014-12-23 MED ORDER — HYDROCODONE-ACETAMINOPHEN 5-325 MG PO TABS: 1.0000 | ORAL_TABLET | ORAL | Status: DC | PRN

## 2014-12-23 MED ORDER — METHYLPREDNISOLONE ACETATE 40 MG/ML IJ SUSP
40.0000 mg | Freq: Once | INTRAMUSCULAR | Status: AC
  Administered 2014-12-23: 40 mg via INTRA_ARTICULAR

## 2014-12-23 MED ORDER — HYDROCODONE-ACETAMINOPHEN 5-325 MG PO TABS: 1.0000 | ORAL_TABLET | Freq: Four times a day (QID) | ORAL | Status: AC | PRN

## 2014-12-23 NOTE — Patient Instructions (Signed)
You have an acute gout flare on top of the bruising you did from the fall. Both knees were aspirated today with the left knee injected with cortisone. Because the right knee fluid is bloody I do not recommend injecting this in case you have a hairline fracture - this could delay healing with a cortisone injection. Take aleve 2 tabs twice a day with food OR ibuprofen 600mg  three times a day with food for pain and inflammation. Norco as needed for severe pain (no driving or working on this medicine). Call me in 1 week if you're still struggling.

## 2014-12-23 NOTE — ED Notes (Signed)
Pt states he is noncompliant with meds "for my heart rate"-EDNP notified

## 2014-12-23 NOTE — ED Provider Notes (Signed)
CSN: [id number]     Arrival date & time 12/23/14  1428 History   First MD Initiated Contact with Patient 12/23/14 1441     Chief Complaint  Patient presents with  . Leg Pain     (Consider location/radiation/quality/duration/timing/severity/associated sxs/prior Treatment) HPI Comments: Pt comes in with c/o bilateral knee pain that started 2 days ago after falling.pt didn't have a loc. He got dizzy and fell to his knees. He has tried ibuprofen and warm soaks without relief. States that he has had gout to the left knee in the past and is not sure if this is related. States that he feels like both of them are swollen. Denies redness or warmth  The history is provided by the patient. No language interpreter was used.    Past Medical History  Diagnosis Date  . Diabetes mellitus without complication   . Hypertension   . Gout    History reviewed. No pertinent past surgical history. Family History  Problem Relation Age of Onset  . Sudden death Neg Hx   . Hypertension Neg Hx   . Hyperlipidemia Neg Hx   . Heart attack Neg Hx   . Diabetes Neg Hx    History  Substance Use Topics  . Smoking status: Never Smoker   . Smokeless tobacco: Never Used  . Alcohol Use: 7.2 oz/week    12 Cans of beer per week    Review of Systems  All other systems reviewed and are negative.     Allergies  Review of patient's allergies indicates no known allergies.  Home Medications   Prior to Admission medications   Medication Sig Start Date End Date Taking? Authorizing Provider  glipiZIDE (GLUCOTROL XL) 10 MG 24 hr tablet Take 10 mg by mouth daily with breakfast.   Yes Historical Provider, MD  lisinopril (PRINIVIL,ZESTRIL) 10 MG tablet Take 10 mg by mouth daily.   Yes Historical Provider, MD  metFORMIN (GLUCOPHAGE) 500 MG tablet Take 500 mg by mouth 2 (two) times daily with a meal.   Yes Historical Provider, MD  simvastatin (ZOCOR) 40 MG tablet Take 40 mg by mouth daily.   Yes Historical Provider, MD   allopurinol (ZYLOPRIM) 300 MG tablet Take 1 tablet (300 mg total) by mouth 2 (two) times daily. 11/25/13   Monica Bectonhomas J Thekkekandam, MD  HYDROcodone-acetaminophen (NORCO/VICODIN) 5-325 MG per tablet Take 1 tablet by mouth every 8 (eight) hours as needed for moderate pain. 11/25/13   Monica Bectonhomas J Thekkekandam, MD  meloxicam (MOBIC) 15 MG tablet One tab PO qAM with breakfast for 2 weeks, then daily prn pain. 11/25/13   Monica Bectonhomas J Thekkekandam, MD   BP 119/66 mmHg  Pulse 131  Temp(Src) 97.8 F (36.6 C) (Oral)  Resp 18  Ht 6\' 5"  (1.956 m)  Wt 255 lb (115.667 kg)  BMI 30.23 kg/m2  SpO2 99% Physical Exam  Constitutional: He is oriented to person, place, and time. He appears well-developed and well-nourished.  Cardiovascular: Normal rate and regular rhythm.   Pulmonary/Chest: Effort normal and breath sounds normal.  Musculoskeletal:  Bruising noted to knee bilaterally. No definite swelling appreciated. Pt has full rom  Neurological: He is alert and oriented to person, place, and time. Coordination normal.  Skin: Skin is warm and dry.  Nursing note and vitals reviewed.   ED Course  Procedures (including critical care time) Labs Review Labs Reviewed - No data to display  Imaging Review Dg Knee Complete 4 Views Left  12/23/2014   CLINICAL DATA:  Fall  onto both knees 12/21/2014 with bilateral knee pain. Initial encounter.  EXAM: LEFT KNEE - COMPLETE 4+ VIEW  COMPARISON:  None currently available.  FINDINGS: Large knee joint effusion without fatty component. There is no explanatory fracture or dislocation.  Finger-like bony excrescence from the lateral proximal tibial metaphysis which shows corticomedullary differentiation, appearance consistent with an osteochondroma.  IMPRESSION: 1. Large knee joint effusion without acute osseous finding. 2. Proximal tibia osteochondroma.   Electronically Signed   By: Tiburcio Pea M.D.   On: 12/23/2014 15:15   Dg Knee Complete 4 Views Right  12/23/2014   CLINICAL DATA:   44 year old male who fell 2 days ago with pain. Initial encounter.  EXAM: RIGHT KNEE - COMPLETE 4+ VIEW  COMPARISON:  10/24/2013.  FINDINGS: Large suprapatellar joint effusion, increased from prior an remains mildly hyperdense. The patella appears stable. Other osseous structures appear stable. No acute fracture or dislocation is identified.  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 No superimposed acute osseous abnormality is identified.   Electronically Signed   By: Augusto Gamble M.D.   On: 12/23/2014 15:13     EKG Interpretation None      MDM   Final diagnoses:  Bilateral knee effusions    Pt has similar history and has immobilizers. Will treat with hydrocodone for pain and have follow up with Dr Pearletha Forge. Not consistent with a septic joint. Pt states that he is supposed to be taking medication for his high heart rate but hasn't been      Teressa Lower, NP 12/23/14 1531  Elwin Mocha, MD 12/23/14 1544

## 2014-12-23 NOTE — Discharge Instructions (Signed)

## 2014-12-23 NOTE — ED Notes (Signed)
Pt states he got dizzy and fell to knees in a store 2 days ago-denies actual LOC with fall after much questioning about events-pt had total recall of events

## 2014-12-23 NOTE — ED Notes (Signed)
Pt reports he "got dizzy" and fell on Monday- Tuesday began having leg swelling and pain- reports difficulty walking due to pain- pt drove self to ED

## 2014-12-25 DIAGNOSIS — I10 Essential (primary) hypertension: Secondary | ICD-10-CM | POA: Insufficient documentation

## 2014-12-25 DIAGNOSIS — M25561 Pain in right knee: Secondary | ICD-10-CM | POA: Insufficient documentation

## 2014-12-25 DIAGNOSIS — E785 Hyperlipidemia, unspecified: Secondary | ICD-10-CM | POA: Insufficient documentation

## 2014-12-25 DIAGNOSIS — M25562 Pain in left knee: Secondary | ICD-10-CM

## 2014-12-25 NOTE — Progress Notes (Signed)
Patient ID: Jonathan Park, male   DOB: 02/24/1971, 44 y.o.   MRN: [id number]  PCP: No PCP Per Patient  Subjective:   HPI: Patient is a 44 y.o. male here for bilateral knee effusions.  11/03/13: Patient denies known injury. He states he woke up on 12/4 and noticed his right knee was very swollen. Initially was warm, painful. 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 Pain has improved since and swelling gone down a little. Has tried percocet, crutches, icing, immobilizer. History of similar pain in his foot before. Was going to have an MRI in the ED but had to leave before obtaining this.  12/23/14: Overall patient has done well since last visit. Reports he fell directly onto his knees on 2/1 and following this both knees have swollen. No bruising. Left knee hurts worse than right. Radiographs in ED negative for fracture. Taking allopurinol. Feels similar to when he had gout flare over a year ago. No catching or locking. Has immobilizer he is using for left knee.  Past Medical History  Diagnosis Date  . Diabetes mellitus without complication   . Hypertension   . Gout     Current Outpatient Prescriptions on File Prior to Visit  Medication Sig Dispense Refill  . allopurinol (ZYLOPRIM) 300 MG tablet Take 1 tablet (300 mg total) by mouth 2 (two) times daily. 60 tablet 3  . glipiZIDE (GLUCOTROL XL) 10 MG 24 hr tablet Take 10 mg by mouth daily with breakfast.    . lisinopril (PRINIVIL,ZESTRIL) 10 MG tablet Take 10 mg by mouth daily.    . meloxicam (MOBIC) 15 MG tablet One tab PO qAM with breakfast for 2 weeks, then daily prn pain. 30 tablet 3  . metFORMIN (GLUCOPHAGE) 500 MG tablet Take 500 mg by mouth 2 (two) times daily with a meal.    . simvastatin (ZOCOR) 40 MG tablet Take 40 mg by mouth daily.     No current facility-administered medications on file prior to visit.    No past surgical history on file.  No Known  Allergies  History   Social History  . Marital Status: Single    Spouse Name: N/A    Number of Children: N/A  . Years of Education: N/A   Occupational History  . Not on file.   Social History Main Topics  . Smoking status: Never Smoker   . Smokeless tobacco: Never Used  . Alcohol Use: 7.2 oz/week    12 Cans of beer per week  . Drug Use: No  . Sexual Activity: No   Other Topics Concern  . Not on file   Social History Narrative    Family History  Problem Relation Age of Onset  . Sudden death Neg Hx   . Hypertension Neg Hx   . Hyperlipidemia Neg Hx   . Heart attack Neg Hx   . Diabetes Neg Hx     BP 108/74 mmHg  Pulse 130  Ht 6\' 5"  (1.956 m)  Wt 255 lb (115.667 kg)  BMI 30.23 kg/m2  Review of Systems: See HPI above.    Objective:  Physical Exam:  Gen: NAD  Left knee: Large effusion.  No bruising, other deformity. Mild anterior diffuse TTP.  No other TTP. ROM 0 - 90 degrees.  Flexion limited - painful at 90 degrees. Negative ant/post drawers. Negative valgus/varus testing. Negative lachmanns. Negative mcmurrays, apleys, patellar apprehension. NV intact distally.  Right knee: Large effusion.  No  bruising, other deformity. Mild anterior diffuse TTP.  No other TTP. ROM 0 - 90 degrees.  Flexion limited - painful at 90 degrees. Negative ant/post drawers. Negative valgus/varus testing. Negative lachmanns. Negative mcmurrays, apleys, patellar apprehension. NV intact distally.    Assessment & Plan:  1. Bilateral knee pain and effusions - Radiographs negative, reassuring.  Would not expect fracture with fall from standing position directly onto his knees.  Aspiration of left knee consistent with a gout flare and injected with cortisone but right knee aspirated bloody fluid - did not follow this with cortisone injection as a result.  Likely just a contusion given otherwise normal exam though hairline fracture is a possibility but unlikely as noted above.  Icing,  compression, nsaids, elevation.  Crutches and immobilizer only if needed.    After informed written consent patient was lying supine on exam table.  Right knee was prepped with alcohol swab.  Utilizing superolateral approach, 3 mL of marcaine was used for local anesthesia.  Then using an 18g needle on 60cc syringe, 63 mL of sanguinous fluid was aspirated from right knee.  Patient tolerated procedure well without immediate complications.  After informed written consent patient was lying supine on exam table.  Left knee was prepped with alcohol swab.  Utilizing superolateral approach, 3 mL of marcaine was used for local anesthesia.  Then using an 18g needle on 60cc syringe, 21 mL of cloudy straw-colored fluid was aspirated from left knee.  Knee was then injected with 3:1 marcaine:depomedrol.  Patient tolerated procedure well without immediate complications.

## 2014-12-25 NOTE — Assessment & Plan Note (Signed)
Radiographs negative, reassuring.  Would not expect fracture with fall from standing position directly onto his knees.  Aspiration of left knee consistent with a gout flare and injected with cortisone but right knee aspirated bloody fluid - did not follow this with cortisone injection as a result.  Likely just a contusion given otherwise normal exam though hairline fracture is a possibility but unlikely as noted above.  Icing, compression, nsaids, elevation.  Crutches and immobilizer only if needed.    After informed written consent patient was lying supine on exam table.  Right knee was prepped with alcohol swab.  Utilizing superolateral approach, 3 mL of marcaine was used for local anesthesia.  Then using an 18g needle on 60cc syringe, 63 mL of sanguinous fluid was aspirated from right knee.  Patient tolerated procedure well without immediate complications.  After informed written consent patient was lying supine on exam table.  Left knee was prepped with alcohol swab.  Utilizing superolateral approach, 3 mL of marcaine was used for local anesthesia.  Then using an 18g needle on 60cc syringe, 21 mL of cloudy straw-colored fluid was aspirated from left knee.  Knee was then injected with 3:1 marcaine:depomedrol.  Patient tolerated procedure well without immediate complications.

## 2015-01-04 ENCOUNTER — Ambulatory Visit: Payer: Self-pay | Admitting: Family Medicine

## 2015-01-05 ENCOUNTER — Encounter: Payer: Self-pay | Admitting: Family Medicine

## 2015-01-05 ENCOUNTER — Ambulatory Visit (INDEPENDENT_AMBULATORY_CARE_PROVIDER_SITE_OTHER): Payer: Self-pay | Admitting: Family Medicine

## 2015-01-05 VITALS — BP 127/93 | HR 105 | Ht 77.0 in | Wt 255.0 lb

## 2015-01-05 DIAGNOSIS — M25562 Pain in left knee: Secondary | ICD-10-CM

## 2015-01-05 DIAGNOSIS — M25561 Pain in right knee: Secondary | ICD-10-CM

## 2015-01-05 MED ORDER — PREDNISONE (PAK) 10 MG PO TABS: ORAL_TABLET | ORAL | Status: DC

## 2015-01-05 NOTE — Patient Instructions (Signed)
Take prednisone as prescribed. Follow up with me in 2 weeks. Out of work during that time.

## 2015-01-06 NOTE — Progress Notes (Signed)
Patient ID: Jonathan Park, male   DOB: September 06, 1971, 44 y.o.   MRN: [id number]  PCP: No PCP Per Patient  Subjective:   HPI: Patient is a 44 y.o. male here for bilateral knee effusions.  11/03/13: Patient denies known injury. He states he woke up on 12/4 and noticed his right knee was very swollen. Initially was warm, painful. 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 Pain has improved since and swelling gone down a little. Has tried percocet, crutches, icing, immobilizer. History of similar pain in his foot before. Was going to have an MRI in the ED but had to leave before obtaining this.  12/23/14: Overall patient has done well since last visit. Reports he fell directly onto his knees on 2/1 and following this both knees have swollen. No bruising. Left knee hurts worse than right. Radiographs in ED negative for fracture. Taking allopurinol. Feels similar to when he had gout flare over a year ago. No catching or locking. Has immobilizer he is using for left knee.  2/16: Patient reports improvement especially in left knee - will pop at times but now no swelling and minimal pain. Right knee still with 4/10 level of pain and swelling. Has difficulty with squatting, lots of walking and standing mainly from right knee now. No catching, locking.  Past Medical History  Diagnosis Date  . Diabetes mellitus without complication   . Hypertension   . Gout     Current Outpatient Prescriptions on File Prior to Visit  Medication Sig Dispense Refill  . allopurinol (ZYLOPRIM) 300 MG tablet Take 1 tablet (300 mg total) by mouth 2 (two) times daily. 60 tablet 3  . glipiZIDE (GLUCOTROL XL) 10 MG 24 hr tablet Take 10 mg by mouth daily with breakfast.    . HYDROcodone-acetaminophen (NORCO/VICODIN) 5-325 MG per tablet Take 1 tablet by mouth every 6 (six) hours as needed. 60 tablet 0  . lisinopril (PRINIVIL,ZESTRIL) 10 MG tablet Take 10 mg by mouth daily.     . meloxicam (MOBIC) 15 MG tablet One tab PO qAM with breakfast for 2 weeks, then daily prn pain. 30 tablet 3  . metFORMIN (GLUCOPHAGE) 500 MG tablet Take 500 mg by mouth 2 (two) times daily with a meal.    . simvastatin (ZOCOR) 40 MG tablet Take 40 mg by mouth daily.     No current facility-administered medications on file prior to visit.    No past surgical history on file.  No Known Allergies  History   Social History  . Marital Status: Single    Spouse Name: N/A  . Number of Children: N/A  . Years of Education: N/A   Occupational History  . Not on file.   Social History Main Topics  . Smoking status: Never Smoker   . Smokeless tobacco: Never Used  . Alcohol Use: 7.2 oz/week    12 Cans of beer per week  . Drug Use: No  . Sexual Activity: No   Other Topics Concern  . Not on file   Social History Narrative    Family History  Problem Relation Age of Onset  . Sudden death Neg Hx   . Hypertension Neg Hx   . Hyperlipidemia Neg Hx   . Heart attack Neg Hx   . Diabetes Neg Hx     BP 127/93 mmHg  Pulse 105  Ht  (1.956 m)  Wt 255 lb (115.667 kg)  BMI 30.23 kg/m2  Review  of Systems: See HPI above.    Objective:  Physical Exam:  Gen: NAD  Left knee: No effusion, bruising, other deformity. Mild anterior diffuse TTP.  No other TTP. FROM. Negative ant/post drawers. Negative valgus/varus testing. Negative lachmanns. Negative mcmurrays, apleys, patellar apprehension. NV intact distally.  Right knee: Mod effusion.  No bruising, other deformity. Mild anterior diffuse TTP.  No other TTP. ROM 0 - 90 degrees.  Flexion limited - painful at 90 degrees. Negative ant/post drawers. Negative valgus/varus testing. Negative lachmanns. Negative mcmurrays, apleys, patellar apprehension. NV intact distally.    Assessment & Plan:  1. Bilateral knee pain and effusions - Radiographs negative, reassuring.  Acute gout flare though concern with bloody aspirate of right  knee he has a contusion, less likely hairline fracture - as discussed would not injected cortisone into right knee.  Discussed options and went ahead with prednisone dose pack.  F/u in 2 weeks - out of work during that time.  Icing, compression, elevation.  Crutches and immobilizer only if needed.

## 2015-01-06 NOTE — Assessment & Plan Note (Signed)
Radiographs negative, reassuring.  Acute gout flare though concern with bloody aspirate of right knee he has a contusion, less likely hairline fracture - as discussed would not injected cortisone into right knee.  Discussed options and went ahead with prednisone dose pack.  F/u in 2 weeks - out of work during that time.  Icing, compression, elevation.  Crutches and immobilizer only if needed.

## 2015-01-19 ENCOUNTER — Encounter: Payer: Self-pay | Admitting: Family Medicine

## 2015-01-19 ENCOUNTER — Ambulatory Visit (INDEPENDENT_AMBULATORY_CARE_PROVIDER_SITE_OTHER): Payer: Self-pay | Admitting: Family Medicine

## 2015-01-19 VITALS — BP 105/74 | HR 109 | Ht 77.0 in | Wt 255.0 lb

## 2015-01-19 DIAGNOSIS — M25562 Pain in left knee: Secondary | ICD-10-CM

## 2015-01-19 DIAGNOSIS — M25561 Pain in right knee: Secondary | ICD-10-CM

## 2015-01-19 NOTE — Patient Instructions (Signed)
Follow up with me in 4 weeks. If not improving would consider imaging of your right knee. See work note.

## 2015-01-21 NOTE — Assessment & Plan Note (Signed)
Radiographs negative, reassuring.  Both knees have improved to this point though had mild reinjury of right this weekend.  Acute gout flare underlying issue for both knees though also with contusion of right knee - as discussed would not inject cortisone into right knee given bloody aspirate on this side.  Tolerated prednisone dose pack which helped.  Icing, compression, elevation.  Out of work for 2 more weeks given his reinjury this weekend then return to full duty without restrictions.

## 2015-01-21 NOTE — Progress Notes (Signed)
Patient ID: Jonathan Park, male   DOB: 09/09/1971, 44 y.o.   MRN: [id number]  PCP: No PCP Per Patient  Subjective:   HPI: Patient is a 44 y.o. male here for bilateral knee effusions.  11/03/13: Patient denies known injury. He states he woke up on 12/4 and noticed his right knee was very swollen. Initially was warm, painful. 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 Pain has improved since and swelling gone down a little. Has tried percocet, crutches, icing, immobilizer. History of similar pain in his foot before. Was going to have an MRI in the ED but had to leave before obtaining this.  12/23/14: Overall patient has done well since last visit. Reports he fell directly onto his knees on 2/1 and following this both knees have swollen. No bruising. Left knee hurts worse than right. Radiographs in ED negative for fracture. Taking allopurinol. Feels similar to when he had gout flare over a year ago. No catching or locking. Has immobilizer he is using for left knee.  2/16: Patient reports improvement especially in left knee - will pop at times but now no swelling and minimal pain. Right knee still with 4/10 level of pain and swelling. Has difficulty with squatting, lots of walking and standing mainly from right knee now. No catching, locking.  3/1: Patient reports he has improved though tweaked the right knee over the weekend when stepping into a hole. Pain on this side 3/10 level. No swelling. Left knee has improved.  Past Medical History  Diagnosis Date  . Diabetes mellitus without complication   . Hypertension   . Gout     Current Outpatient Prescriptions on File Prior to Visit  Medication Sig Dispense Refill  . allopurinol (ZYLOPRIM) 300 MG tablet Take 1 tablet (300 mg total) by mouth 2 (two) times daily. 60 tablet 3  . glipiZIDE (GLUCOTROL XL) 10 MG 24 hr tablet Take 10 mg by mouth daily with breakfast.    .  HYDROcodone-acetaminophen (NORCO/VICODIN) 5-325 MG per tablet Take 1 tablet by mouth every 6 (six) hours as needed. 60 tablet 0  . lisinopril (PRINIVIL,ZESTRIL) 10 MG tablet Take 10 mg by mouth daily.    . meloxicam (MOBIC) 15 MG tablet One tab PO qAM with breakfast for 2 weeks, then daily prn pain. 30 tablet 3  . metFORMIN (GLUCOPHAGE) 500 MG tablet Take 500 mg by mouth 2 (two) times daily with a meal.    . predniSONE (STERAPRED UNI-PAK) 10 MG tablet 6 tabs po day 1, 5 tabs po day 2, 4 tabs po day 3, 3 tabs po day 4, 2 tabs po day 5, 1 tab po day 6 21 tablet 0  . simvastatin (ZOCOR) 40 MG tablet Take 40 mg by mouth daily.     No current facility-administered medications on file prior to visit.    No past surgical history on file.  No Known Allergies  History   Social History  . Marital Status: Single    Spouse Name: N/A  . Number of Children: N/A  . Years of Education: N/A   Occupational History  . Not on file.   Social History Main Topics  . Smoking status: Never Smoker   . Smokeless tobacco: Never Used  . Alcohol Use: 7.2 oz/week    12 Cans of beer per week  . Drug Use: No  . Sexual Activity: No   Other Topics Concern  . Not on file  Social History Narrative    Family History  Problem Relation Age of Onset  . Sudden death Neg Hx   . Hypertension Neg Hx   . Hyperlipidemia Neg Hx   . Heart attack Neg Hx   . Diabetes Neg Hx     BP 105/74 mmHg  Pulse 109  Ht  (1.956 m)  Wt 255 lb (115.667 kg)  BMI 30.23 kg/m2  Review of Systems: See HPI above.    Objective:  Physical Exam:  Gen: NAD  Left knee: No effusion, bruising, other deformity. No TTP.  FROM. Negative ant/post drawers. Negative valgus/varus testing. Negative lachmanns. Negative mcmurrays, apleys, patellar apprehension. NV intact distally.  Right knee: Mild effusion.  No bruising, other deformity. Mild anterior diffuse TTP.  No other TTP. ROM 0 - 120 degrees.   Negative ant/post  drawers. Negative valgus/varus testing. Negative lachmanns. Negative mcmurrays, apleys, patellar apprehension. NV intact distally.    Assessment & Plan:  1. Bilateral knee pain and effusions - Radiographs negative, reassuring.  Both knees have improved to this point though had mild reinjury of right this weekend.  Acute gout flare underlying issue for both knees though also with contusion of right knee - as discussed would not inject cortisone into right knee given bloody aspirate on this side.  Tolerated prednisone dose pack which helped.  Icing, compression, elevation.  Out of work for 2 more weeks given his reinjury this weekend then return to full duty without restrictions.

## 2015-07-12 IMAGING — CR DG KNEE COMPLETE 4+V*R*
4 series · 4 of 4 positions shown · non-contrast
Comparison: 10/24/2013.

CLINICAL DATA: 43-year-old male who fell 2 days ago with pain.
Initial encounter.

EXAM:
RIGHT KNEE - COMPLETE 4+ VIEW

[t knee ap right]
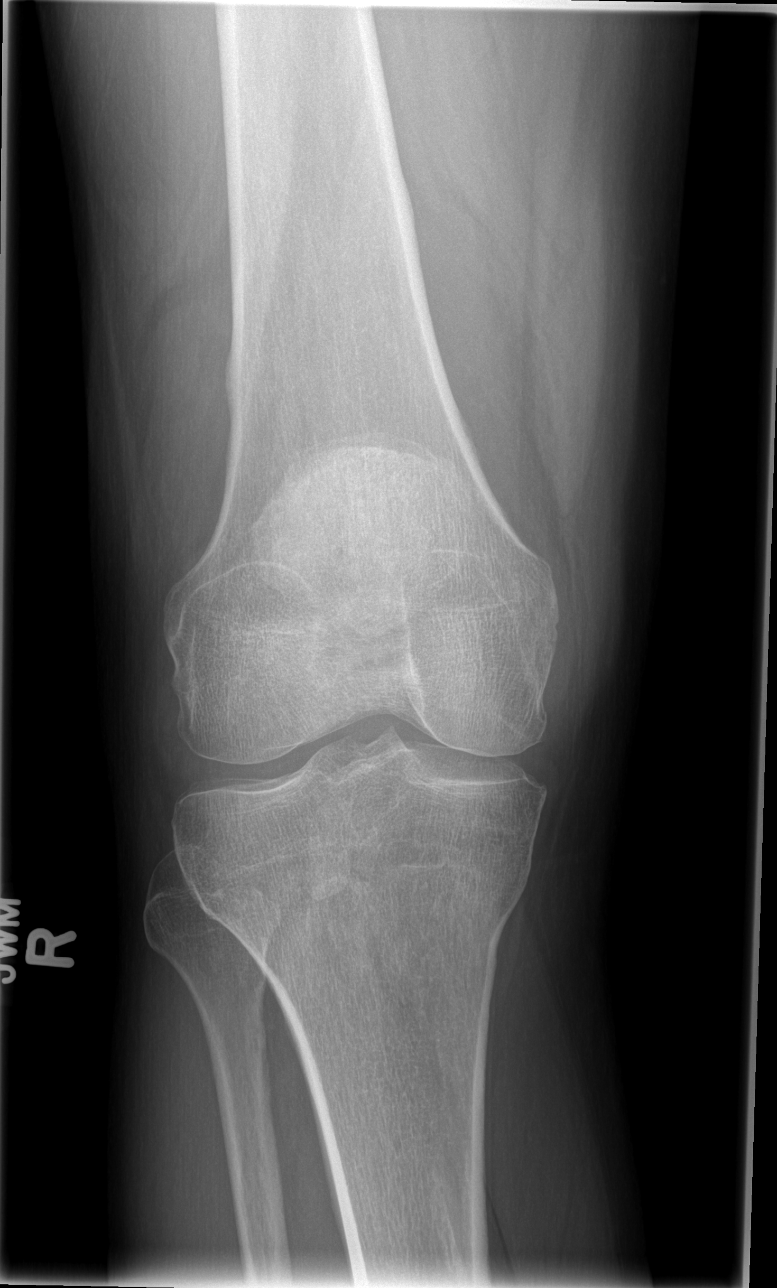

[t knee oblique right (1 of 2)]
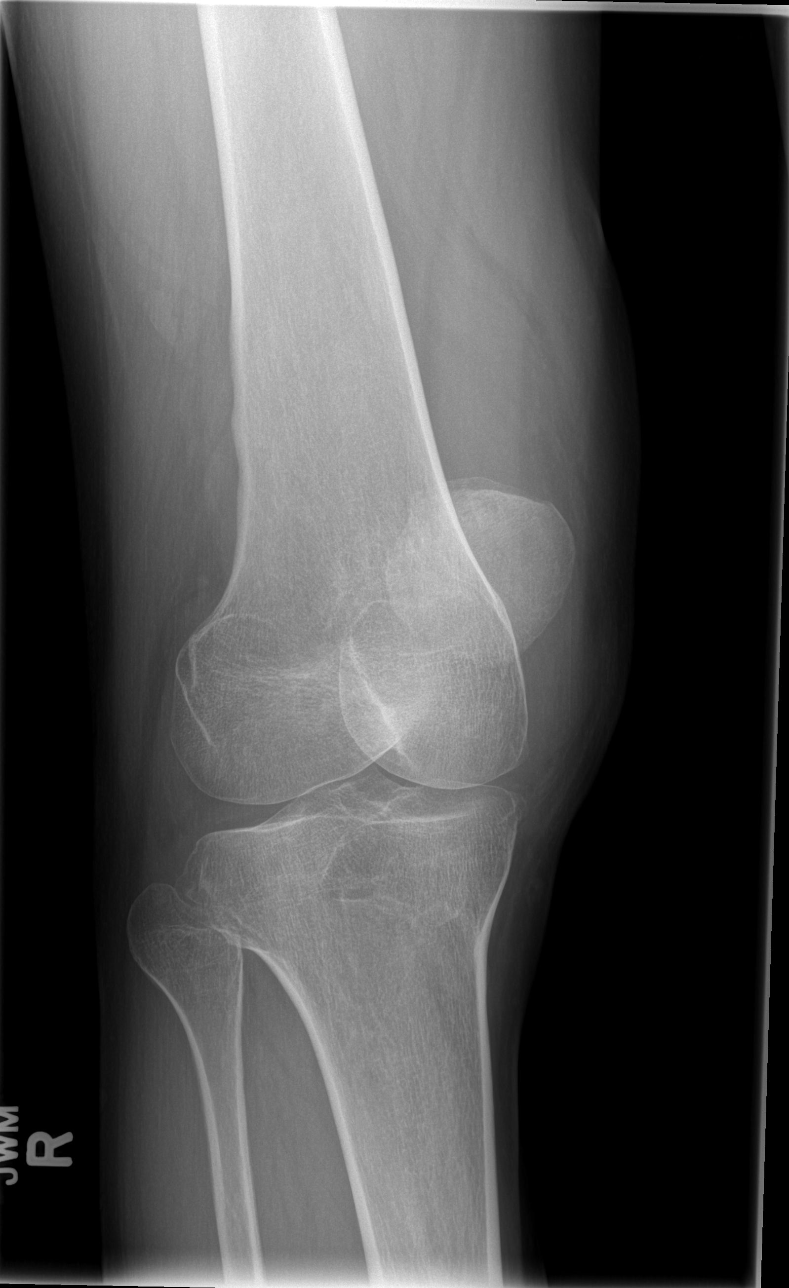

[t knee oblique right (2 of 2)]
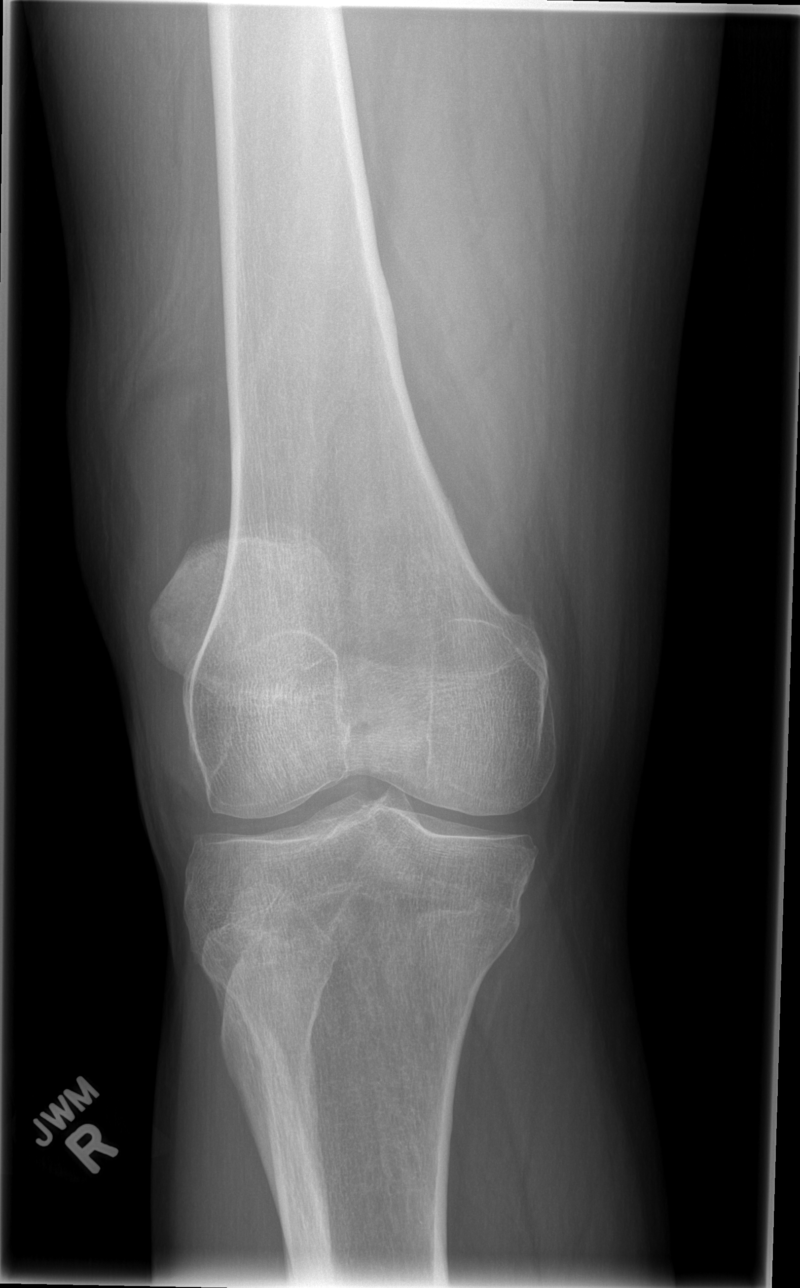

[t knee lat right]
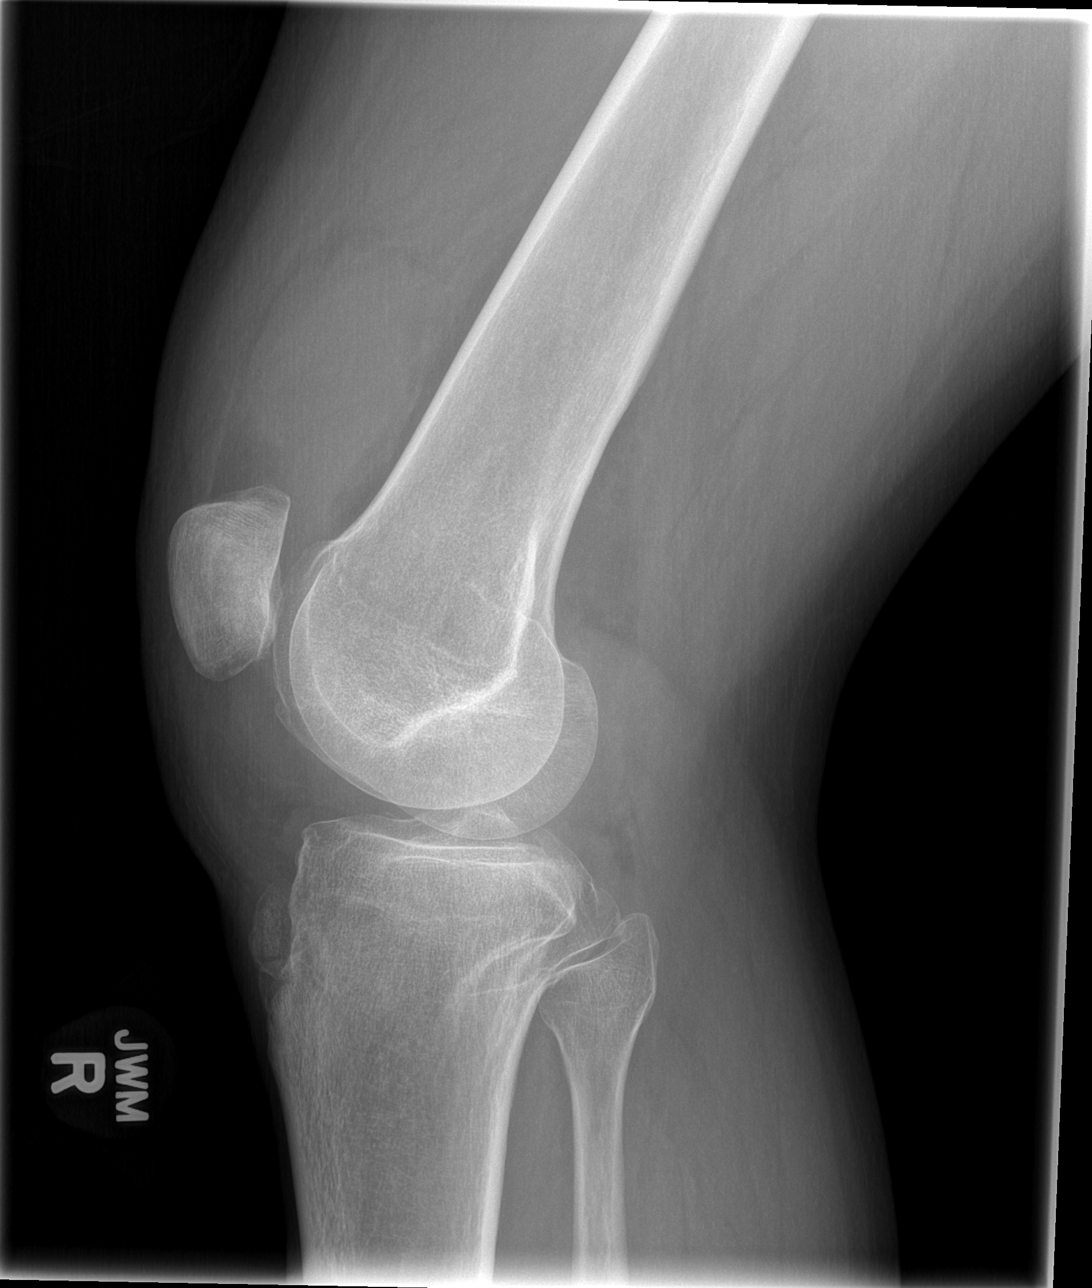

[4 of 4 positions shown; findings below may reference images not displayed]

FINDINGS: Large suprapatellar joint effusion, increased from prior an remains
mildly hyperdense. The patella appears stable. Other osseous
structures appear stable. No acute fracture or dislocation is
identified.
IMPRESSION: Chronic large joint effusion of unclear etiology. No superimposed
acute osseous abnormality is identified.

## 2016-02-11 ENCOUNTER — Encounter: Payer: Self-pay | Admitting: Emergency Medicine

## 2016-02-11 ENCOUNTER — Emergency Department (INDEPENDENT_AMBULATORY_CARE_PROVIDER_SITE_OTHER)
Admission: EM | Admit: 2016-02-11 | Discharge: 2016-02-11 | Disposition: A | Discharge status: Home / Self Care | Payer: 59 | Source: Home / Self Care | Attending: Family Medicine | Admitting: Family Medicine

## 2016-02-11 DIAGNOSIS — E1165 Type 2 diabetes mellitus with hyperglycemia: Secondary | ICD-10-CM

## 2016-02-11 DIAGNOSIS — Z789 Other specified health status: Secondary | ICD-10-CM | POA: Diagnosis not present

## 2016-02-11 DIAGNOSIS — H538 Other visual disturbances: Secondary | ICD-10-CM | POA: Diagnosis not present

## 2016-02-11 DIAGNOSIS — Z9119 Patient's noncompliance with other medical treatment and regimen: Secondary | ICD-10-CM | POA: Diagnosis not present

## 2016-02-11 DIAGNOSIS — Z7289 Other problems related to lifestyle: Secondary | ICD-10-CM

## 2016-02-11 DIAGNOSIS — Z9114 Patient's other noncompliance with medication regimen: Secondary | ICD-10-CM

## 2016-02-11 DIAGNOSIS — IMO0002 Reserved for concepts with insufficient information to code with codable children: Secondary | ICD-10-CM

## 2016-02-11 DIAGNOSIS — E118 Type 2 diabetes mellitus with unspecified complications: Secondary | ICD-10-CM

## 2016-02-11 LAB — POCT URINALYSIS DIP (MANUAL ENTRY)
Bilirubin, UA: NEGATIVE
Blood, UA: NEGATIVE
Glucose, UA: 100 — AB
Leukocytes, UA: NEGATIVE
Nitrite, UA: NEGATIVE
Protein Ur, POC: 100 — AB
Spec Grav, UA: 1.025 (ref 1.005–1.03)
Urobilinogen, UA: 0.2 (ref 0–1)
pH, UA: 5.5 (ref 5–8)

## 2016-02-11 LAB — POCT CBC W AUTO DIFF (K'VILLE URGENT CARE)

## 2016-02-11 LAB — POCT FASTING CBG KUC MANUAL ENTRY: POCT Glucose (KUC): 200 mg/dL — AB (ref 70–99)

## 2016-02-11 MED ORDER — METFORMIN HCL 500 MG PO TABS: 500.0000 mg | ORAL_TABLET | Freq: Two times a day (BID) | ORAL | Status: AC

## 2016-02-11 NOTE — ED Provider Notes (Signed)
CSN: [id number]     Arrival date & time 02/11/16  1714 History   First MD Initiated Contact with Patient 02/11/16 1747     Chief Complaint  Patient presents with  . Eye Problem   (Consider location/radiation/quality/duration/timing/severity/associated sxs/prior Treatment) HPI  The pt is a 45yo male with hx of NIDDM and HTN presenting to [facility] with concern for blurred vision that comes and goes throughout the day.  He is concerned his sugar and blood pressure are too high.  He got health insurance about 4 months ago but he has not been taking his metformin, lisinopril, or simvastatin for several months due to lack of insurance and no PCP.  Denies fever, chills, n/v/d. Denies abdominal pain or headache. Denies chest pain, palpitations or SOB.  He does report drinking alcohol on occasion as he heard it helps regulate blood sugar.  He also reports loosing about 35-40 pounds over the last several months.  Pt also requesting a work note as he had to leave work early today due to concern for possible high blood pressure.   Past Medical History  Diagnosis Date  . Diabetes mellitus without complication (HCC)   . Hypertension   . Gout    History reviewed. No pertinent past surgical history. Family History  Problem Relation Age of Onset  . Sudden death Neg Hx   . Hypertension Neg Hx   . Hyperlipidemia Neg Hx   . Heart attack Neg Hx   . Diabetes Neg Hx    Social History  Substance Use Topics  . Smoking status: Never Smoker   . Smokeless tobacco: Never Used  . Alcohol Use: 7.2 oz/week    12 Cans of beer per week    Review of Systems  Constitutional: Negative for fever and chills.  HENT: Negative for congestion, ear pain, sore throat, trouble swallowing and voice change.   Eyes: Positive for visual disturbance. Negative for photophobia, pain, discharge, redness and itching.  Respiratory: Negative for cough and shortness of breath.   Cardiovascular: Negative for chest pain and palpitations.   Gastrointestinal: Negative for nausea, vomiting, abdominal pain and diarrhea.  Endocrine: Negative for polydipsia, polyphagia and polyuria.  Genitourinary: Negative for dysuria, urgency, frequency and hematuria.  Musculoskeletal: Negative for myalgias, back pain and arthralgias.  Skin: Negative for color change and rash.  Neurological: Negative for dizziness, seizures, syncope, weakness, light-headedness and headaches.  All other systems reviewed and are negative.   Allergies  Review of patient's allergies indicates no known allergies.  Home Medications   Prior to Admission medications   Medication Sig Start Date End Date Taking? Authorizing Provider  allopurinol (ZYLOPRIM) 300 MG tablet Take 1 tablet (300 mg total) by mouth 2 (two) times daily. 11/25/13   Monica Becton, MD  glipiZIDE (GLUCOTROL XL) 10 MG 24 hr tablet Take 10 mg by mouth daily with breakfast.    Historical Provider, MD  HYDROcodone-acetaminophen (NORCO/VICODIN) 5-325 MG per tablet Take 1 tablet by mouth every 6 (six) hours as needed. 12/23/14   Lenda Kelp, MD  lisinopril (PRINIVIL,ZESTRIL) 10 MG tablet Take 10 mg by mouth daily.    Historical Provider, MD  meloxicam (MOBIC) 15 MG tablet One tab PO qAM with breakfast for 2 weeks, then daily prn pain. 11/25/13   Monica Becton, MD  metFORMIN (GLUCOPHAGE) 500 MG tablet Take 1 tablet (500 mg total) by mouth 2 (two) times daily with a meal. 02/11/16   Junius Finner, PA-C  predniSONE (STERAPRED UNI-PAK) 10 MG tablet  6 tabs po day 1, 5 tabs po day 2, 4 tabs po day 3, 3 tabs po day 4, 2 tabs po day 5, 1 tab po day 6 01/05/15   Lenda KelpShane R Hudnall, MD  simvastatin (ZOCOR) 40 MG tablet Take 40 mg by mouth daily.    Historical Provider, MD   Meds Ordered and Administered this Visit  Medications - No data to display  BP 107/70 mmHg  Pulse 114  Temp(Src) 98.3 F (36.8 C) (Oral)  Resp 16  Ht 6\' 5"  (1.956 m)  Wt 239 lb (108.41 kg)  BMI 28.34 kg/m2  SpO2 97% No data  found.   Physical Exam  Constitutional: He is oriented to person, place, and time. He appears well-developed and well-nourished.  HENT:  Head: Normocephalic and atraumatic.  Right Ear: Tympanic membrane normal.  Left Ear: Tympanic membrane normal.  Nose: Nose normal.  Mouth/Throat: Uvula is midline, oropharynx is clear and moist and mucous membranes are normal.  Eyes: Conjunctivae and EOM are normal. Pupils are equal, round, and reactive to light. Right eye exhibits no discharge. Left eye exhibits no discharge. No scleral icterus.  Neck: Normal range of motion. Neck supple.  Cardiovascular: Normal rate, regular rhythm and normal heart sounds.   Pulmonary/Chest: Effort normal and breath sounds normal. No respiratory distress. He has no wheezes. He has no rales. He exhibits no tenderness.  Abdominal: Soft. Bowel sounds are normal. He exhibits no distension and no mass. There is no tenderness. There is no rebound and no guarding.  Musculoskeletal: Normal range of motion.  Neurological: He is alert and oriented to person, place, and time.  Alert to person, place, and time, speech is clear.   Skin: Skin is warm and dry.  Nursing note and vitals reviewed.   ED Course  Procedures (including critical care time)  Labs Review Labs Reviewed  POCT FASTING CBG [facility] MANUAL ENTRY - Abnormal; Notable for the following:    POCT Glucose ([facility]) 200 (*)    All other components within normal limits  POCT URINALYSIS DIP (MANUAL ENTRY) - Abnormal; Notable for the following:    Glucose, UA =100 (*)    Ketones, POC UA trace (5) (*)    Protein Ur, POC =100 (*)    All other components within normal limits  BASIC METABOLIC PANEL  POCT CBC W AUTO DIFF ([facility])    Imaging Review No results found.   Visual Acuity Review  Right Eye Distance: 20/25 Left Eye Distance: 20/25 Bilateral Distance: without correction  MDM   1. Uncontrolled type 2 diabetes mellitus with complication, without  long-term current use of insulin (HCC)   2. Blurred vision   3. H/O medication noncompliance   4. Alcohol use (HCC)    Pt concerned for intermittent blurred vision and has not checked his BP or sugar in several months due to lack of insurance and has not taken his medications for several months.   CBG- 200 Doubt DKA  Trace ketones in UA. CBC: WNL BMP-pending  Reassured pt BP is actually on the lower end of normal.  Pt does report purposefully losing a lot of weight over the last 1 year.  Applauded pt and advised him no BP medication is needed at this time as it might make his pressure too low, and cause him to feel dizzy or pass out.   Will refill metformin.  Strongly encouraged to f/u with PCP for ongoing healthcare needs and recheck of labs next week. Discussed symptoms  that warrant emergent care in the ED.  Patient verbalized understanding and agreement with treatment plan.      Junius Finner, PA-C 02/11/16 1935

## 2016-02-11 NOTE — ED Notes (Signed)
Patient here for evaluation of vision with emphasis on possible blood pressure or diabetic influences. Has hypertension and diabetes type 2 but has not taken medications for months due to lack of funds/insurance.

## 2016-02-12 ENCOUNTER — Telehealth: Payer: Self-pay | Admitting: Emergency Medicine

## 2016-02-12 LAB — BASIC METABOLIC PANEL
BUN: 15 mg/dL (ref 7–25)
CO2: 26 mmol/L (ref 20–31)
Calcium: 9.8 mg/dL (ref 8.6–10.3)
Chloride: 96 mmol/L — ABNORMAL LOW (ref 98–110)
Creat: 1.21 mg/dL (ref 0.60–1.35)
Glucose, Bld: 189 mg/dL — ABNORMAL HIGH (ref 65–99)
Potassium: 4.2 mmol/L (ref 3.5–5.3)
Sodium: 141 mmol/L (ref 135–146)

## 2016-11-29 ENCOUNTER — Ambulatory Visit (INDEPENDENT_AMBULATORY_CARE_PROVIDER_SITE_OTHER): Payer: Self-pay | Admitting: Sports Medicine

## 2016-11-29 ENCOUNTER — Encounter: Payer: Self-pay | Admitting: Sports Medicine

## 2016-11-29 DIAGNOSIS — M109 Gout, unspecified: Secondary | ICD-10-CM

## 2016-11-29 MED ORDER — IBUPROFEN 200 MG PO TABS: 800.0000 mg | ORAL_TABLET | Freq: Three times a day (TID) | ORAL | 0 refills | Status: AC | PRN

## 2016-11-29 NOTE — Progress Notes (Signed)
  Subjective:    CC:  Right  Knee swelling  HPI: This is a pleasant 46 year old male with a history of gout, he is well-known to me though I haven't seen him in some time. We aspirated and injected his knee sometime ago, uric acid crystals were seen in the crystal analysis. He has seen multiple providers more recently [facility], and had an aspiration and injection a couple of weeks ago. Unfortunately he has really not been taking his allopurinol as directed. Now having a recurrence of swelling in the right knee and desires repeat arthrocentesis. Pain is severe, persistent, no constitutional symptoms.  Past medical history:  Negative.  See flowsheet/record as well for more information.  Surgical history: Negative.  See flowsheet/record as well for more information.  Family history: Negative.  See flowsheet/record as well for more information.  Social history: Negative.  See flowsheet/record as well for more information.  Allergies, and medications have been entered into the medical record, reviewed, and no changes needed.   Review of Systems: No fevers, chills, night sweats, weight loss, chest pain, or shortness of breath.   Objective:    General: Well Developed, well nourished, and in no acute distress.  Neuro: Alert and oriented x3, extra-ocular muscles intact, sensation grossly intact.  HEENT: Normocephalic, atraumatic, pupils equal round reactive to light, neck supple, no masses, no lymphadenopathy, thyroid nonpalpable.  Skin: Warm and dry, no rashes. Cardiac: Regular rate and rhythm, no murmurs rubs or gallops, no lower extremity edema.  Respiratory: Clear to auscultation bilaterally. Not using accessory muscles, speaking in full sentences. Right Knee: Hot and visibly swollen with a palpable fluid wave ROM normal in flexion and extension and lower leg rotation. Ligaments with solid consistent endpoints including ACL, PCL, LCL, MCL. Negative Mcmurray's and  provocative meniscal tests. Non painful patellar compression. Patellar and quadriceps tendons unremarkable. Hamstring and quadriceps strength is normal.  Procedure: Real-time Ultrasound Guided aspiration of right knee Device: GE Logiq E  Verbal informed consent obtained.  Time-out conducted.  Noted no overlying erythema, induration, or other signs of local infection.  Skin prepped in a sterile fashion.  Local anesthesia: Topical Ethyl chloride.  With sterile technique and under real time ultrasound guidance:  Using an 18-gauge needle aspirated 70 mL of slightly cloudy yellowish fluid Completed without difficulty  Pain immediately resolved suggesting accurate placement of the medication.  Advised to call if fevers/chills, erythema, induration, drainage, or persistent bleeding.  Images permanently stored and available for review in the ultrasound unit.  Impression: Technically successful ultrasound guided injection.  Impression and Recommendations:    Gout 70 mL aspirated from the right knee, I think he is somewhat noncompliant. Regimen will be 300 mg allopurinol twice a day, ibuprofen 800 mg 3 times a day. He will return and we can recheck his uric acid levels, if they are less than 5 but he continues to get effusions then we will restart colchicine twice a day. If they are greater than 5 then we will switch him to [place]ampic.

## 2016-11-29 NOTE — Assessment & Plan Note (Signed)
70 mL aspirated from the right knee, I think he is somewhat noncompliant. Regimen will be 300 mg allopurinol twice a day, ibuprofen 800 mg 3 times a day. He will return and we can recheck his uric acid levels, if they are less than 5 but he continues to get effusions then we will restart colchicine twice a day. If they are greater than 5 then we will switch him to [place]ampic.

## 2016-11-29 NOTE — Patient Instructions (Signed)
Regimen will be 300 mg allopurinol twice a day, ibuprofen 800 mg 3 times a day. You will return and we can recheck your uric acid levels in one month, if they are less than 5 but you continue to get effusions then we will restart colchicine twice a day. If they are greater than 5 then we will switch you to Zurampic.

## 2016-12-01 ENCOUNTER — Telehealth: Payer: Self-pay

## 2016-12-01 NOTE — Telephone Encounter (Signed)
Pt left VM stating he hasn't been able to go back to work yet and would like to know if he can have a note keeping him out until Monday. Please advise.

## 2016-12-01 NOTE — Telephone Encounter (Signed)
Letter in box. 

## 2016-12-04 NOTE — Telephone Encounter (Signed)
Patient advised that letter should be up front ready for pick up.

## 2016-12-15 ENCOUNTER — Ambulatory Visit (INDEPENDENT_AMBULATORY_CARE_PROVIDER_SITE_OTHER): Payer: Self-pay | Admitting: Family Medicine

## 2016-12-15 ENCOUNTER — Encounter: Payer: Self-pay | Admitting: Family Medicine

## 2016-12-15 VITALS — BP 142/101 | Wt 235.0 lb

## 2016-12-15 DIAGNOSIS — M1A0690 Idiopathic chronic gout, unspecified knee, without tophus (tophi): Secondary | ICD-10-CM

## 2016-12-15 MED ORDER — COLCHICINE 0.6 MG PO TABS: ORAL_TABLET | ORAL | 3 refills | Status: DC

## 2016-12-15 NOTE — Progress Notes (Signed)
Jonathan Park is a 46 y.o. male who [facility] today for right knee swelling. Patient has history of recurrent gout typically involving his right knee. He was seen by Dr. Benjamin Stainhekkekandam on January 10 where he had aspiration of right knee with steroid injection.  Patient was recently started back on allopurinol. He takes 300 mg twice daily. He's been taking colchicine intermittently and not prophylactically with the onset of allopurinol. His course is complicated by multiple different medical providers all providing contradictory care. [facility]  He notes the last time his uric acid was checked was in November and he thinks it was around 5 or 6 but he is not sure.   Past Medical History:  Diagnosis Date  . Diabetes mellitus without complication (HCC)   . Gout   . Hypertension    No past surgical history on file. Social History  Substance Use Topics  . Smoking status: Never Smoker  . Smokeless tobacco: Never Used  . Alcohol use 7.2 oz/week    12 Cans of beer per week   family history is not on file.  ROS as above:  Medications: Current Outpatient Prescriptions  Medication Sig Dispense Refill  . allopurinol (ZYLOPRIM) 300 MG tablet Take 1 tablet (300 mg total) by mouth 2 (two) times daily. 60 tablet 3  . colchicine 0.6 MG tablet 1 tab 1-2x daily for gout prevention 60 tablet 3  . glipiZIDE (GLUCOTROL XL) 10 MG 24 hr tablet Take 10 mg by mouth daily with breakfast.    . HYDROcodone-acetaminophen (NORCO/VICODIN) 5-325 MG per tablet Take 1 tablet by mouth every 6 (six) hours as needed. 60 tablet 0  . ibuprofen (ADVIL) 200 MG tablet Take 4 tablets (800 mg total) by mouth every 8 (eight) hours as needed. 30 tablet 0  . lisinopril (PRINIVIL,ZESTRIL) 10 MG tablet Take 10 mg by mouth daily.    . metFORMIN (GLUCOPHAGE) 500 MG  tablet Take 1 tablet (500 mg total) by mouth 2 (two) times daily with a meal. 60 tablet 0  . simvastatin (ZOCOR) 40 MG tablet Take 40 mg by mouth daily.     No current facility-administered medications for this visit.    No Known Allergies  Health Maintenance Health Maintenance  Topic Date Due  . HEMOGLOBIN A1C  1973/07/03  . PNEUMOCOCCAL POLYSACCHARIDE VACCINE (1) 08/23/1975  . FOOT EXAM  08/23/1983  . OPHTHALMOLOGY EXAM  08/23/1983  . HIV Screening  08/22/1988  . TETANUS/TDAP  08/22/1992  . INFLUENZA VACCINE  06/20/2016     Exam:  BP (!) 142/101   Wt 235 lb (106.6 kg)   BMI 27.87 kg/m  Gen: Well NAD MSK: Right knee effusion nontender decreased knee flexion. Antalgic gait.   No results found for this or any previous visit (from the past 72 hour(s)). No results found.    Assessment and Plan: 46 y.o. male with chronic gout. I don't think patient has had ideal control of chronic gout due to a combination of medication nonadherence confusion and fractured care.  Plan to continue allopurinol 3 mg twice daily. I stressed the importance of prophylactic colchicine for a few months. Allopurinol. He should take 0.6 mg of colchicine daily. He may take it twice daily for intermittent gout flares. Use NSAIDs sparingly as needed. Return as needed.   No orders of the defined types were placed in this encounter.   Discussed warning  signs or symptoms. Please see discharge instructions. Patient expresses understanding.

## 2016-12-15 NOTE — Patient Instructions (Signed)
Thank you for coming in today. Continue the allopurinol.  Take colchiceine daily for 3 months.  Return as needed.   Low-Purine Diet Purines are compounds that affect the level of uric acid in your body. A low-purine diet is a diet that is low in purines. Eating a low-purine diet can prevent the level of uric acid in your body from getting too high and causing gout or kidney stones or both. What do I need to know about this diet?  Choose low-purine foods. Examples of low-purine foods are listed in the next section.  Drink plenty of fluids, especially water. Fluids can help remove uric acid from your body. Try to drink 8-16 cups (1.9-3.8 L) a day.  Limit foods high in fat, especially saturated fat, as fat makes it harder for the body to get rid of uric acid. Foods high in saturated fat include pizza, cheese, ice cream, whole milk, fried foods, and gravies. Choose foods that are lower in fat and lean sources of protein. Use olive oil when cooking as it contains healthy fats that are not high in saturated fat.  Limit alcohol. Alcohol interferes with the elimination of uric acid from your body. If you are having a gout attack, avoid all alcohol.  Keep in mind that different people's bodies react differently to different foods. You will probably learn over time which foods do or do not affect you. If you discover that a food tends to cause your gout to flare up, avoid eating that food. You can more freely enjoy foods that do not cause problems. If you have any questions about a food item, talk to your dietitian or health care provider. Which foods are low, moderate, and high in purines? The following is a list of foods that are low, moderate, and high in purines. You can eat any amount of the foods that are low in purines. You may be able to have small amounts of foods that are moderate in purines. Ask your health care provider how much of a food moderate in purines you can have. Avoid foods high in  purines. Grains  Foods low in purines: Enriched white bread, pasta, rice, cake, cornbread, popcorn.  Foods moderate in purines: Whole-grain breads and cereals, wheat germ, bran, oatmeal. Uncooked oatmeal. Dry wheat bran or wheat germ.  Foods high in purines: Pancakes, [place] toast, biscuits, muffins. Vegetables  Foods low in purines: All vegetables, except those that are moderate in purines.  Foods moderate in purines: Asparagus, cauliflower, spinach, mushrooms, green peas. Fruits  All fruits are low in purines. Meats and other Protein Foods  Foods low in purines: Eggs, nuts, peanut butter.  Foods moderate in purines: 80-90% lean beef, lamb, veal, pork, poultry, fish, eggs, peanut butter, nuts. Crab, lobster, oysters, and shrimp. Cooked dried beans, peas, and lentils.  Foods high in purines: Anchovies, sardines, herring, mussels, tuna, codfish, scallops, trout, and haddock. [place]. Organ meats (such as liver or kidney). [place]. Game meat. Goose. Sweetbreads. Dairy  All dairy foods are low in purines. Low-fat and fat-free dairy products are best because they are low in saturated fat. Beverages  Drinks low in purines: Water, carbonated beverages, tea, coffee, cocoa.  Drinks moderate in purines: Soft drinks and other drinks sweetened with high-fructose corn syrup. Juices. To find whether a food or drink is sweetened with high-fructose corn syrup, look at the ingredients list.  Drinks high in purines: Alcoholic beverages (such as beer). Condiments  Foods low in purines: Salt, herbs, olives, pickles, relishes,  vinegar.  Foods moderate in purines: Butter, margarine, oils, mayonnaise. Fats and Oils  Foods low in purines: All types, except gravies and sauces made with meat.  Foods high in purines: Gravies and sauces made with meat. Other Foods  Foods low in purines: Sugars, sweets, gelatin. Cake. Soups made without meat.  Foods moderate in purines: Meat-based or fish-based soups,  broths, or bouillons. Foods and drinks sweetened with high-fructose corn syrup.  Foods high in purines: High-fat desserts (such as ice cream, cookies, cakes, pies, doughnuts, and chocolate). Contact your dietitian for more information on foods that are not listed here.  This information is not intended to replace advice given to you by your health care provider. Make sure you discuss any questions you have with your health care provider. Document Released: 03/03/2011 Document Revised: 04/13/2016 Document Reviewed: 10/13/2013 [employer] Interactive Patient Education  2017 [employer]

## 2016-12-20 ENCOUNTER — Telehealth: Payer: Self-pay | Admitting: *Deleted

## 2016-12-20 DIAGNOSIS — M1A49X0 Other secondary chronic gout, multiple sites, without tophus (tophi): Secondary | ICD-10-CM

## 2016-12-20 NOTE — Telephone Encounter (Signed)
[facility] and [facility] stating that [facility] needs more cholchicine and allopurinol. Per the patient the amount per day was increased and he needs a letter reflecting what was last prescribed. (The [facility] fax [phone])

## 2016-12-21 MED ORDER — ALLOPURINOL 300 MG PO TABS: 300.0000 mg | ORAL_TABLET | Freq: Two times a day (BID) | ORAL | 3 refills | Status: AC

## 2016-12-21 MED ORDER — COLCHICINE 0.6 MG PO TABS: ORAL_TABLET | ORAL | 3 refills | Status: AC

## 2016-12-21 NOTE — Telephone Encounter (Signed)
Rx printed and letter printed ready for fax to [facility] fax [phone]
